# Patient Record
Sex: Female | Born: 1965 | Race: Black or African American | Hispanic: No | Marital: Single | Smoking: Never smoker
Health system: Southern US, Community
[De-identification: ages and names within clinical notes are randomized; demographics above are authoritative.]

## PROBLEM LIST (undated history)

## (undated) DIAGNOSIS — M1990 Unspecified osteoarthritis, unspecified site: Secondary | ICD-10-CM

## (undated) DIAGNOSIS — I82409 Acute embolism and thrombosis of unspecified deep veins of unspecified lower extremity: Secondary | ICD-10-CM

## (undated) DIAGNOSIS — I10 Essential (primary) hypertension: Secondary | ICD-10-CM

## (undated) DIAGNOSIS — E119 Type 2 diabetes mellitus without complications: Secondary | ICD-10-CM

## (undated) HISTORY — PX: ABDOMINAL HYSTERECTOMY: SHX81

## (undated) HISTORY — PX: APPENDECTOMY: SHX54

---

## 2016-03-30 ENCOUNTER — Emergency Department (HOSPITAL_COMMUNITY): Payer: Managed Care, Other (non HMO)

## 2016-03-30 ENCOUNTER — Emergency Department (HOSPITAL_COMMUNITY)
Admission: EM | Admit: 2016-03-30 | Discharge: 2016-03-30 | Disposition: A | Discharge status: Home / Self Care | Payer: Managed Care, Other (non HMO) | Attending: Emergency Medicine | Admitting: Emergency Medicine

## 2016-03-30 ENCOUNTER — Encounter (HOSPITAL_COMMUNITY): Payer: Self-pay | Admitting: *Deleted

## 2016-03-30 DIAGNOSIS — M25562 Pain in left knee: Secondary | ICD-10-CM | POA: Diagnosis present

## 2016-03-30 MED ORDER — IBUPROFEN 400 MG PO TABS: 600.0000 mg | ORAL_TABLET | Freq: Once | ORAL | Status: DC

## 2016-03-30 MED ORDER — NAPROXEN 500 MG PO TABS: 500.0000 mg | ORAL_TABLET | Freq: Two times a day (BID) | ORAL | 0 refills | Status: DC

## 2016-03-30 NOTE — ED Triage Notes (Signed)
PT reports she her knee pain started over 1 week ago. 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

## 2016-03-30 NOTE — ED Triage Notes (Signed)
PT reports taking Advil with out relief of LT knee pain.

## 2016-03-30 NOTE — ED Notes (Signed)
Declined W/C at D/C and was escorted to lobby by RN. 

## 2016-03-30 NOTE — Discharge Instructions (Signed)
Take your medication as prescribed as needed for pain/swelling. I recommend resting, elevating and applying ice to her left knee for 15 minutes 3-4 times daily. If your pain has not improved over the next week I recommend following up with the orthopedic clinic listed above. Please return to the Emergency Department if symptoms worsen or new onset of fever, redness, swelling, warmth, numbness, tingling, weakness..Marland Kitchen

## 2016-03-30 NOTE — ED Provider Notes (Signed)
[facility]-EMERGENCY DEPT Provider Note   CSN: [id number] Arrival date & time: 03/30/16  [id number]     History   Chief Complaint Chief Complaint  Patient presents with  . Knee Pain    HPI Nancy Cobb is a 50 y.o. female.  Patient is a 50 year old female no pertinent past medical history presents the ED with complaint of left knee pain, onset one week. 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 She notes over the past week she has had gradually worsening pain to her left medial knee with mild swelling. She notes the pain is worse with ambulating or flexion. Denies any recent fall or known injury. Patient reports feeling an intermittent "clicking" sensation to her left medial knee when ambulating. Denies fever, redness, warmth, numbness, tingling, weakness. Denies any prior injury or surgery to her left knee.      History reviewed. No pertinent past medical history.  There are no active problems to display for this patient.   Past Surgical History:  Procedure Laterality Date  . ABDOMINAL HYSTERECTOMY    . APPENDECTOMY      OB History    No data available       Home Medications    Prior to Admission medications   Medication Sig Start Date End Date Taking? Authorizing Provider  naproxen (NAPROSYN) 500 MG tablet Take 1 tablet (500 mg total) by mouth 2 (two) times daily. 03/30/16   Barrett HenleNicole Elizabeth Nadeau, PA-C    Family History History reviewed. No pertinent family history.  Social History Social History  Substance Use Topics  . Smoking status: Never Smoker  . Smokeless tobacco: Never Used  . Alcohol use No     Allergies   Review of patient's allergies indicates no known allergies.   Review of Systems Review of Systems  Constitutional: Negative for fever.  Musculoskeletal: Positive for arthralgias (leftk nee) and joint swelling.  Skin: Negative for wound.  Neurological:  Negative for weakness and numbness.     Physical Exam Updated Vital Signs BP 152/86 (BP Location: Right Arm)   Pulse (!) 56   Temp 98.4 F (36.9 C) (Oral)   Resp 16   Ht 5\' 5"  (1.651 m)   Wt 99.8 kg   SpO2 100%   BMI 36.61 kg/m   Physical Exam  Constitutional: She is oriented to person, place, and time. She appears well-developed and well-nourished. No distress.  HENT:  Head: Normocephalic and atraumatic.  Eyes: Conjunctivae and EOM are normal. Right eye exhibits no discharge. Left eye exhibits no discharge. No scleral icterus.  Neck: Normal range of motion. Neck supple.  Cardiovascular: Normal rate and intact distal pulses.   Pulmonary/Chest: Effort normal.  Abdominal: She exhibits no distension.  Musculoskeletal: She exhibits tenderness. She exhibits no edema or deformity.       Left knee: She exhibits decreased range of motion (due to reported pain), swelling (mild swelling to medial anterior knee) and MCL laxity. She exhibits no effusion, no ecchymosis, no deformity, no laceration, no erythema, normal alignment, no LCL laxity and normal patellar mobility. Tenderness found. Medial joint line tenderness noted.       Legs: Mild swelling and tenderness noted to left anterior medial knee. Decreased range of motion due to reported pain with flexion. No ACL/MCL/PCL/LCL laxity. No joint effusion, erythema or warmth noted. No tenderness over left femur or tib-fib. Sensation grossly intact. 2+ PT pulse.  Neurological: She is alert and oriented to person, place, and time.  Skin: Skin is warm and dry. Capillary refill takes less than 2 seconds. She is not diaphoretic.  Nursing note and vitals reviewed.    ED Treatments / Results  Labs (all labs ordered are listed, but only abnormal results are displayed) Labs Reviewed - No data to display  EKG  EKG Interpretation None       Radiology Dg Knee Complete 4 Views Left  Result Date: 03/30/2016 CLINICAL DATA:  50 year old female  with a history of aching left knee pain EXAM: LEFT KNEE - COMPLETE 4+ VIEW COMPARISON:  None. FINDINGS: No acute fracture identified. Tricompartmental osteoarthritis. No joint effusion. No radiopaque foreign body. IMPRESSION: Negative for acute bony abnormality. Tricompartmental osteoarthritis Signed, Yvone Neu. Loreta Ave, DO [facility] Radiology Electronically Signed   By: Gilmer Mor D.O.   On: 03/30/2016 09:01    Procedures Procedures (including critical care time)  Medications Ordered in ED Medications - No data to display   Initial Impression / Assessment and Plan / ED Course  I have reviewed the triage vital signs and the nursing notes.  Pertinent labs & imaging results that were available during my care of the patient were reviewed by me and considered in my medical decision making (see chart for details).  Clinical Course    Patient presents with left knee pain and swelling that started over the past week. She reports walking on her feet for 8 hours at her job where she delivers mail. Denies any recent fall or injury. Denies fever. VSS. Exam revealed mild swelling and tenderness to left anterior medial knee. Decreased range of motion due to reported pain. No ligament laxity noted. Pt is able ambulate, no bony abnormality or deformity, no erythema or excessive heat, no evidence of cellulitis, DVT, or septic joint.  Left knee x-ray revealed osteoarthritis, no acute abnormality. Discussed results and plan for discharge with patient. Plan to discharge patient home with symptomatic treatment including NSAIDs and cryotherapy. Patient given orthopedic follow-up and advise to schedule an appointment if her symptoms have not improved over the next week. Discussed return precautions.  Final Clinical Impressions(s) / ED Diagnoses   Final diagnoses:  Acute pain of left knee    New Prescriptions Discharge Medication List as of 03/30/2016  9:13 AM      START taking these medications   Details  naproxen (NAPROSYN) 500 MG tablet Take 1 tablet (500 mg total) by mouth 2 (two) times daily., Starting Sat 03/30/2016, Print         Satira Sark [place], [place] 03/30/16 1639    Glynn Octave, MD 03/30/16 [phone]

## 2017-03-10 ENCOUNTER — Emergency Department (HOSPITAL_COMMUNITY)
Admission: EM | Admit: 2017-03-10 | Discharge: 2017-03-11 | Disposition: A | Discharge status: Home / Self Care | Payer: Managed Care, Other (non HMO) | Attending: Emergency Medicine | Admitting: Emergency Medicine

## 2017-03-10 ENCOUNTER — Encounter (HOSPITAL_COMMUNITY): Payer: Self-pay | Admitting: *Deleted

## 2017-03-10 DIAGNOSIS — M545 Low back pain, unspecified: Secondary | ICD-10-CM

## 2017-03-10 DIAGNOSIS — I10 Essential (primary) hypertension: Secondary | ICD-10-CM | POA: Diagnosis not present

## 2017-03-10 HISTORY — DX: Essential (primary) hypertension: I10

## 2017-03-10 MED ORDER — HYDROCODONE-ACETAMINOPHEN 5-325 MG PO TABS
1.0000 | ORAL_TABLET | Freq: Once | ORAL | Status: AC
  Administered 2017-03-11: 1 via ORAL
  Filled 2017-03-10: qty 1

## 2017-03-10 NOTE — ED Triage Notes (Signed)
The pt lifted a heavy piece of equipment Saturday at work  Since then she has been working  But the pain is not getting better  lmp none

## 2017-03-10 NOTE — ED Provider Notes (Signed)
[facility]-EMERGENCY DEPT Provider Note   CSN: [id number] Arrival date & time: 03/10/17  2217     History   Chief Complaint Chief Complaint  Patient presents with  . Back Pain    HPI Nancy Cobb is a 51 y.o. female.  The history is provided by the patient and medical records. No language interpreter was used.  Back Pain   Pertinent negatives include no fever, no numbness, no abdominal pain, no dysuria and no weakness.   Nancy Cobb is a 51 y.o. female  with a PMH of HTN who presents to the Emergency Department complaining of acute onset of low back pain 2 days ago. 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 She was at work on Saturday (2 days ago) and picked up a large package weighing approximately 35 pounds. Within seconds of lifting the package, she suddenly developed sharp pain to central low back. She continued to work throughout the day and felt as if the pain was improving. When she awoke the next morning, pain had worsened. She tried ibuprofen with no improvement. Pain is worse when sitting and certain movements. Pain is better when standing. No history of similar sxs. Patient denies upper back or neck pain. No fever, saddle anesthesia, weakness, numbness, urinary complaints including retention/incontinence. No history of cancer, IVDU, or recent spinal procedures.  Past Medical History:  Diagnosis Date  . Hypertension     There are no active problems to display for this patient.   Past Surgical History:  Procedure Laterality Date  . ABDOMINAL HYSTERECTOMY    . APPENDECTOMY      OB History    No data available       Home Medications    Prior to Admission medications   Medication Sig Start Date End Date Taking? Authorizing Provider  methocarbamol (ROBAXIN) 500 MG tablet Take 1 tablet (500 mg total) by mouth 2 (two) times daily as needed for muscle spasms. 03/11/17   Ward, Chase Picket, PA-C  naproxen (NAPROSYN) 500 MG tablet Take 1 tablet (500 mg total) by  mouth 2 (two) times daily as needed for moderate pain. 03/11/17   Ward, Chase Picket, PA-C    Family History No family history on file.  Social History Social History  Substance Use Topics  . Smoking status: Never Smoker  . Smokeless tobacco: Never Used  . Alcohol use No     Allergies   Patient has no known allergies.   Review of Systems Review of Systems  Constitutional: Negative for chills and fever.  Gastrointestinal: Negative for abdominal pain, nausea and vomiting.  Genitourinary: Negative for difficulty urinating, dysuria, frequency and urgency.  Musculoskeletal: Positive for back pain. Negative for neck pain.  Neurological: Negative for weakness and numbness.     Physical Exam Updated Vital Signs BP 139/83   Pulse (!) 59   Temp 98.7 F (37.1 C)   Resp 16   Ht  (1.676 m)   Wt 101.6 kg (224 lb)   SpO2 98%   BMI 36.15 kg/m   Physical Exam  Constitutional: She is oriented to person, place, and time. She appears well-developed and well-nourished.  Neck:  No midline or paraspinal tenderness. Full ROM without pain.  Cardiovascular: Normal rate, regular rhythm, normal heart sounds and intact distal pulses.   Pulmonary/Chest: Effort normal and breath sounds normal. No respiratory distress.  Abdominal: Soft. Bowel sounds are normal. She exhibits no distension. There is no tenderness.  Musculoskeletal:  Arms: Tenderness to palpation as depicted in image. 5/5 muscle strength in bilateral LE's. Straight leg raises are negative bilaterally for radicular symptoms.  Neurological: She is alert and oriented to person, place, and time. She has normal reflexes.  Bilateral lower extremities neurovascularly intact.  Skin: Skin is warm and dry. No rash noted. No erythema.  Nursing note and vitals reviewed.    ED Treatments / Results  Labs (all labs ordered are listed, but only abnormal results are displayed) Labs Reviewed  POC URINE PREG, ED    EKG  EKG  Interpretation None       Radiology Dg Lumbar Spine Complete  Result Date: 03/11/2017 CLINICAL DATA:  Low back pain after lifting injury on Saturday. EXAM: LUMBAR SPINE - COMPLETE 4+ VIEW COMPARISON:  None. FINDINGS: Normal lumbar segmentation. Facet sclerosis from L2 through S1 consistent with degenerative facet arthropathy. Mild-to-moderate disc space narrowing T12-L1 and L5-S1. No spondylolysis or spondylolisthesis. The included sacroiliac joints and sacrum appear intact. No acute fracture of the lumbar spine. IMPRESSION: 1. Mild-to-moderate disc space narrowing at T12-L1 and L5-S1 with associated multilevel degenerative facet arthropathy. 2. No acute fracture or listhesis. Electronically Signed   By: Tollie Eth M.D.   On: 03/11/2017 01:56    Procedures Procedures (including critical care time)  Medications Ordered in ED Medications  HYDROcodone-acetaminophen (NORCO/VICODIN) 5-325 MG per tablet 1 tablet (1 tablet Oral Given 03/11/17 0015)     Initial Impression / Assessment and Plan / ED Course  I have reviewed the triage vital signs and the nursing notes.  Pertinent labs & imaging results that were available during my care of the patient were reviewed by me and considered in my medical decision making (see chart for details).    Nancy Cobb is a 51 y.o. female who presents to ED for low back pain after lifting a heavy box 2 days ago. Patient demonstrates no lower extremity weakness, saddle anesthesia, bowel or bladder incontinence or neuro deficits. No concern for cauda equina. No fevers or other infectious symptoms to suggest that the patient's back pain is due to an infection. Lower extremities are neurovascularly intact. Plan to obtain lumbar spine plain film. If negative, discharge to home with symptomatic home care. Plan discussed with oncoming provider [facility] who will follow up on imaging.   Final Clinical Impressions(s) / ED Diagnoses   Final diagnoses:  Acute  bilateral low back pain without sciatica    New Prescriptions Discharge Medication List as of 03/11/2017  1:25 AM       Ward, Chase Picket, PA-C 03/11/17 1708    Benjiman Core, MD 03/13/17 [phone]

## 2017-03-11 ENCOUNTER — Emergency Department (HOSPITAL_COMMUNITY): Payer: Managed Care, Other (non HMO)

## 2017-03-11 LAB — POC URINE PREG, ED: Preg Test, Ur: NEGATIVE

## 2017-03-11 MED ORDER — NAPROXEN 500 MG PO TABS: 500.0000 mg | ORAL_TABLET | Freq: Two times a day (BID) | ORAL | 0 refills | Status: DC | PRN

## 2017-03-11 MED ORDER — METHOCARBAMOL 500 MG PO TABS: 500.0000 mg | ORAL_TABLET | Freq: Two times a day (BID) | ORAL | 0 refills | Status: DC | PRN

## 2017-03-11 NOTE — ED Notes (Signed)
Ready for xray.

## 2017-03-11 NOTE — ED Notes (Signed)
To xray via wheelchair.

## 2017-03-11 NOTE — Discharge Instructions (Signed)
It was my pleasure taking care of you today!   You have been seen in the Emergency Department today for back pain.   Naproxen twice daily as needed for pain. Robaxin is your muscle relaxer. In addition to this, use ice and/or heat for additional pain relief.  Your back pain should get better over the next 2 weeks. Please follow up with your doctor this week for a recheck if still having symptoms.  COLD THERAPY DIRECTIONS:  Ice or gel packs can be used to reduce both pain and swelling. Ice is the most helpful within the first 24 to 48 hours after an injury or flareup from overusing a muscle or joint.  Ice is effective, has very few side effects, and is safe for most people to use.    Return to the ED for worsening back pain, fever, weakness or numbness of either leg, or if you develop either (1) an inability to urinate or have bowel movements, or (2) loss of your ability to control your bathroom functions (if you start having "accidents"), or if you develop other new symptoms that concern you.

## 2017-12-26 ENCOUNTER — Other Ambulatory Visit (HOSPITAL_COMMUNITY): Payer: Self-pay | Admitting: General Surgery

## 2018-01-12 ENCOUNTER — Encounter: Payer: Self-pay | Admitting: Skilled Nursing Facility1

## 2018-01-12 ENCOUNTER — Ambulatory Visit (HOSPITAL_COMMUNITY)
Admission: RE | Admit: 2018-01-12 | Discharge: 2018-01-12 | Disposition: A | Discharge status: Home / Self Care | Payer: Managed Care, Other (non HMO) | Source: Ambulatory Visit | Attending: General Surgery | Admitting: General Surgery

## 2018-01-12 ENCOUNTER — Other Ambulatory Visit: Payer: Self-pay

## 2018-01-12 ENCOUNTER — Encounter: Payer: Managed Care, Other (non HMO) | Attending: General Surgery | Admitting: Skilled Nursing Facility1

## 2018-01-12 DIAGNOSIS — E669 Obesity, unspecified: Secondary | ICD-10-CM

## 2018-01-12 DIAGNOSIS — Z713 Dietary counseling and surveillance: Secondary | ICD-10-CM | POA: Insufficient documentation

## 2018-01-12 DIAGNOSIS — K30 Functional dyspepsia: Secondary | ICD-10-CM | POA: Insufficient documentation

## 2018-01-12 DIAGNOSIS — Z6839 Body mass index (BMI) 39.0-39.9, adult: Secondary | ICD-10-CM | POA: Insufficient documentation

## 2018-01-12 DIAGNOSIS — J984 Other disorders of lung: Secondary | ICD-10-CM | POA: Diagnosis not present

## 2018-01-12 DIAGNOSIS — Z01818 Encounter for other preprocedural examination: Secondary | ICD-10-CM | POA: Diagnosis present

## 2018-01-12 NOTE — Progress Notes (Signed)
Pre-Op Assessment Visit:  Pre-Operative Sleeve Surgery  Medical Nutrition Therapy:  Appt start time: 3:28  End time:  4:30  Patient was seen on 01/12/2018 for Pre-Operative Nutrition Assessment. Assessment and [facility] of approval [facility] coordinator on 01/12/2018.   According to referral pt needs 3 SWL.   Pt expectation of surgery: to be more healthy, to have less knee pain  Pt expectation of Dietitian: none  Start weight at [facility]: 230.6 BMI: 39.58  24 hr Dietary Recall: First Meal: yogurt or skipped Snack:  Second Meal: yogurt or trail mix or skipped Snack:  Third Meal: fried chicken or burgers with broccoli or green beans or salad and french fries Snack: chips or popcorn  Beverages: sugary green tea, water, gatorade   Encouraged to engage in 150 minutes of moderate physical activity including cardiovascular and weight baring weekly  Handouts given during visit include:  . Pre-Op Goals . Bariatric Surgery Protein Shakes During the appointment today the following Pre-Op Goals were reviewed with the patient: . Maintain or lose weight as instructed by your surgeon . Make healthy food choices . Begin to limit portion sizes . Limited concentrated sugars and fried foods . Keep fat/sugar in the single digits per serving on             food labels . Practice CHEWING your food  (aim for 30 chews per bite or until applesauce consistency) . Practice not drinking 15 minutes before, during, and 30 minutes after each meal/snack . Avoid all carbonated beverages  . Avoid/limit caffeinated beverages  . Avoid all sugar-sweetened beverages . Consume 3 meals per day; eat every 3-5 hours . Make a list of non-food related activities . Aim for 64-100 ounces of FLUID daily  . Aim for at least 60-80 grams of PROTEIN daily . Look for a liquid protein source that contain ?15 g protein and ?5 g carbohydrate  (ex: shakes, drinks, shots)  -Follow  diet recommendations listed below   Energy and Macronutrient Recomendations: Calories: 1600 Carbohydrate: 180 Protein: 120 Fat: 44  Demonstrated degree of understanding via:  Teach Back  Teaching Method Utilized:  Visual Auditory Hands on  Barriers to learning/adherence to lifestyle change: none identified   Patient to call the [facility] to enroll in Pre-Op and Post-Op Nutrition Education when surgery date is scheduled.

## 2018-02-11 ENCOUNTER — Encounter: Payer: Managed Care, Other (non HMO) | Attending: General Surgery | Admitting: Skilled Nursing Facility1

## 2018-02-11 ENCOUNTER — Encounter: Payer: Self-pay | Admitting: Skilled Nursing Facility1

## 2018-02-11 DIAGNOSIS — Z713 Dietary counseling and surveillance: Secondary | ICD-10-CM | POA: Diagnosis present

## 2018-02-11 DIAGNOSIS — Z6839 Body mass index (BMI) 39.0-39.9, adult: Secondary | ICD-10-CM | POA: Insufficient documentation

## 2018-02-11 DIAGNOSIS — E669 Obesity, unspecified: Secondary | ICD-10-CM

## 2018-02-11 NOTE — Progress Notes (Signed)
Sleeve Assessment:   1st SWL Appointment.   According to referral pt needs 3 SWL.   Pt arrives having lost about 6 pounds. Pt states she has cut out soda and drinking more water. Pt states she feels cutting out soda will be sustainable as long as she has tea. Pt states when she goes to the store she reads the label. Pt states she really wants to go out to dinner.  Pt arrives having met her goals she was working on. Pt states she will be going to the panther/steeler game with her son and grandson.    Marland Kitchen Avoid all sugar-sweetened beverages . Consume 3 meals per day; eat every 3-5 hours  When going out: Get a to go box and put half in before you start eating Always order a non starchy veggie Never order fried   Walk 3 days a week after work for 40 minutes   And drink more water: 2 32 ounce cups   Start weight at [facility]: 230.6 Wt: 224.3 pounds BMI: 38.45  MEDICATIONS: see list   DIETARY INTAKE:  24-hr recall:  B ( AM): 2 yogurts  Snk ( AM):  L ( PM): lunch meat  Snk ( PM): trailmix D ( PM): baked chicken or tuna or shrimp with salad  Snk ( PM):  Beverages: diet tea, water, crystal light  Usual physical activity: ADL's  Diet to Follow: 1600 calories 180 g carbohydrates 120 g protein 44 g fat   Nutritional Diagnosis:  [place]-3.3 Overweight/obesity related to past poor dietary habits and physical inactivity as evidenced by patient w/ planned sleeve surgery following dietary guidelines for continued weight loss.    Intervention:  Nutrition counseling for upcoming Bariatric Surgery. Goals: -Encouraged to engage in 150 minutes of moderate physical activity including cardiovascular and weight baring weekly  Teaching Method Utilized:  Visual Auditory Hands on  Handouts given during visit include:  Meal ideas Barriers to learning/adherence to lifestyle change: none identified   Demonstrated degree of understanding via:  Teach Back   Monitoring/Evaluation:  Dietary intake,  exercise,and body weight prn.

## 2018-02-12 ENCOUNTER — Ambulatory Visit: Payer: Self-pay | Admitting: Skilled Nursing Facility1

## 2018-03-16 ENCOUNTER — Encounter: Payer: Managed Care, Other (non HMO) | Attending: General Surgery | Admitting: Skilled Nursing Facility1

## 2018-03-16 ENCOUNTER — Encounter: Payer: Self-pay | Admitting: Skilled Nursing Facility1

## 2018-03-16 DIAGNOSIS — Z713 Dietary counseling and surveillance: Secondary | ICD-10-CM | POA: Insufficient documentation

## 2018-03-16 DIAGNOSIS — Z6839 Body mass index (BMI) 39.0-39.9, adult: Secondary | ICD-10-CM | POA: Diagnosis not present

## 2018-03-16 DIAGNOSIS — E669 Obesity, unspecified: Secondary | ICD-10-CM

## 2018-03-16 NOTE — Progress Notes (Signed)
Sleeve Assessment:  2nd SWL Appointment.   According to referral pt needs 3 SWL.   Pt arrives having lost about 3 months. Pt states she has been in a lot of pain in her knees and needed shots but since the shots she has been able to get back out moving more. Pt states she has only had 2 sodas since the last appt. Pt states she has had 3 meals a day.   Pt states for the next month she wants to work in more activity and needs to work on not drinking with meals and chewing well  Start weight at [facility]: 230.6 Wt: 221.3 pounds BMI: 38.07  MEDICATIONS: see list   DIETARY INTAKE:  24-hr recall:  B ( 6:50AM): 2 yogurts or cereal (frosted mini wheats) Snk (10:30-11 AM): yogurt L (1:30 PM): yogurt and fruit and [place] Snk ( PM): trailmix D ( PM): baked chicken or tuna or shrimp with salad  Snk ( PM):  Beverages: diet tea, water, crystal light  Usual physical activity: ADL's  Diet to Follow: 1600 calories 180 g carbohydrates 120 g protein 44 g fat   Nutritional Diagnosis:  [place]-3.3 Overweight/obesity related to past poor dietary habits and physical inactivity as evidenced by patient w/ planned sleeve surgery following dietary guidelines for continued weight loss.    Intervention:  Nutrition counseling for upcoming Bariatric Surgery. Goals: -Encouraged to engage in 150 minutes of moderate physical activity including cardiovascular and weight baring weekly  Teaching Method Utilized:  Visual Auditory Hands on  Handouts given during visit include:  Meal ideas Barriers to learning/adherence to lifestyle change: none identified   Demonstrated degree of understanding via:  Teach Back   Monitoring/Evaluation:  Dietary intake, exercise,and body weight prn.

## 2018-03-24 ENCOUNTER — Ambulatory Visit (INDEPENDENT_AMBULATORY_CARE_PROVIDER_SITE_OTHER): Payer: Managed Care, Other (non HMO) | Admitting: Psychiatry

## 2018-03-24 DIAGNOSIS — F509 Eating disorder, unspecified: Secondary | ICD-10-CM | POA: Diagnosis not present

## 2018-04-15 ENCOUNTER — Encounter: Payer: Managed Care, Other (non HMO) | Attending: General Surgery | Admitting: Skilled Nursing Facility1

## 2018-04-15 ENCOUNTER — Encounter: Payer: Self-pay | Admitting: Skilled Nursing Facility1

## 2018-04-15 DIAGNOSIS — E669 Obesity, unspecified: Secondary | ICD-10-CM

## 2018-04-15 DIAGNOSIS — Z713 Dietary counseling and surveillance: Secondary | ICD-10-CM | POA: Diagnosis present

## 2018-04-15 DIAGNOSIS — Z6839 Body mass index (BMI) 39.0-39.9, adult: Secondary | ICD-10-CM | POA: Diagnosis not present

## 2018-04-15 NOTE — Progress Notes (Signed)
Sleeve Assessment: 3rd SWL Appointment.   According to referral pt needs 3 SWL.   Pt arrives having gained about 7 pounds. 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 Pt states she struggles with not drinking with meals, eating 3 meals a day. Pt state she will do what she is supposed to do because she wants nothing more than to be successful. Pt states she feels a routine will help her to be successful. Pt states alarms will help. Pt states she feels she has been most successful with liking water now when before she hated water. Pt states he plans on joining [employer].    Start weight at [facility]: 230.6 Wt: 228 BMI: 39.14  MEDICATIONS: see list   DIETARY INTAKE:  24-hr recall:  B ( 6:50AM): 2 yogurts or cereal (frosted mini wheats) Snk (10:30-11 AM): yogurt L (1:30 PM): yogurt and fruit and [place] Snk ( PM): trailmix D ( PM): baked chicken or tuna or shrimp with salad  Snk ( PM):  Beverages: diet tea, water, crystal light  Usual physical activity: ADL's  Diet to Follow: 1600 calories 180 g carbohydrates 120 g protein 44 g fat   Nutritional Diagnosis:  [place]-3.3 Overweight/obesity related to past poor dietary habits and physical inactivity as evidenced by patient w/ planned sleeve surgery following dietary guidelines for continued weight loss.    Intervention:  Nutrition counseling for upcoming Bariatric Surgery. Goals: -Encouraged to engage in 150 minutes of moderate physical activity including cardiovascular and weight baring weekly -Work on being active  -Keep up the great work!  Teaching Method Utilized:  Visual Auditory Hands on  Handouts given during visit include:  Meal ideas Barriers to learning/adherence to lifestyle change: none identified   Demonstrated degree of understanding via:  Teach Back   Monitoring/Evaluation:  Dietary intake, exercise,and body weight prn.

## 2018-04-27 ENCOUNTER — Encounter: Payer: 59 | Attending: General Surgery | Admitting: Skilled Nursing Facility1

## 2018-04-27 ENCOUNTER — Encounter: Payer: Self-pay | Admitting: Skilled Nursing Facility1

## 2018-04-27 DIAGNOSIS — E669 Obesity, unspecified: Secondary | ICD-10-CM

## 2018-04-27 DIAGNOSIS — Z713 Dietary counseling and surveillance: Secondary | ICD-10-CM | POA: Diagnosis present

## 2018-04-27 DIAGNOSIS — Z6839 Body mass index (BMI) 39.0-39.9, adult: Secondary | ICD-10-CM | POA: Diagnosis not present

## 2018-04-27 NOTE — Progress Notes (Signed)
Pre-Operative Nutrition Class:  Appt start time: 1157   End time:  1830.  Patient was seen on 04/27/2018 for Pre-Operative Bariatric Surgery Education [facility].   Surgery date:  Surgery type: sleeve Start weight at [facility]: 230.6 Weight today: 236.2  Samples given per MNT protocol. Patient educated on appropriate usage: Bariatric Advantage Multivitamin Lot # [id number] Exp:10/20  Bariatric Advantage Calcium  Lot # [id number] Exp: jul-25-2020  Renee Pain Protein Powder  Shake Lot # [id number]         March-12-2018 Exp: 08/2019        [id number] The following the learning objectives were met by the patient during this course:  Identify Pre-Op Dietary Goals and will begin 2 weeks pre-operatively  Identify appropriate sources of fluids and proteins   State protein recommendations and appropriate sources pre and post-operatively  Identify Post-Operative Dietary Goals and will follow for 2 weeks post-operatively  Identify appropriate multivitamin and calcium sources  Describe the need for physical activity post-operatively and will follow MD recommendations  State when to call healthcare provider regarding medication questions or post-operative complications  Handouts given during class include:  Pre-Op Bariatric Surgery Diet Handout  Protein Shake Handout  Post-Op Bariatric Surgery Nutrition Handout  [facility] Program Information Flyer  Support Group Information Flyer  [facility] Bariatric Supplements Price List  Follow[facility]: [facility] per MD.

## 2018-04-28 ENCOUNTER — Ambulatory Visit (INDEPENDENT_AMBULATORY_CARE_PROVIDER_SITE_OTHER): Payer: 59 | Admitting: Psychiatry

## 2018-04-28 DIAGNOSIS — F509 Eating disorder, unspecified: Secondary | ICD-10-CM | POA: Diagnosis not present

## 2018-05-08 ENCOUNTER — Ambulatory Visit: Payer: Self-pay | Admitting: General Surgery

## 2018-05-19 ENCOUNTER — Telehealth: Payer: Self-pay | Admitting: Skilled Nursing Facility1

## 2018-05-19 NOTE — Telephone Encounter (Signed)
Pt called to say she was not approved for surgery due to Blood pressure and weight changes up and down.

## 2018-05-21 ENCOUNTER — Telehealth: Payer: Self-pay | Admitting: Skilled Nursing Facility1

## 2018-05-21 NOTE — Telephone Encounter (Signed)
Pt called to vent about the fact she was not approved for surgery by her insurance company. Pt states she feels depressed and like she did all this for no reason.   Dietitian listened to what the pt had to say and advised she let her know if she needed to do anything to help her in her process.

## 2018-06-02 ENCOUNTER — Inpatient Hospital Stay: Admit: 2018-06-02 | Payer: 59 | Admitting: General Surgery

## 2018-06-02 SURGERY — GASTRECTOMY, SLEEVE, LAPAROSCOPIC
Anesthesia: General

## 2018-06-08 ENCOUNTER — Ambulatory Visit: Payer: Self-pay

## 2018-07-02 ENCOUNTER — Emergency Department (HOSPITAL_COMMUNITY): Payer: 59

## 2018-07-02 ENCOUNTER — Emergency Department (HOSPITAL_COMMUNITY)
Admission: EM | Admit: 2018-07-02 | Discharge: 2018-07-02 | Disposition: A | Discharge status: Home / Self Care | Payer: 59 | Attending: Emergency Medicine | Admitting: Emergency Medicine

## 2018-07-02 ENCOUNTER — Encounter (HOSPITAL_COMMUNITY): Payer: Self-pay | Admitting: Emergency Medicine

## 2018-07-02 DIAGNOSIS — M5412 Radiculopathy, cervical region: Secondary | ICD-10-CM | POA: Insufficient documentation

## 2018-07-02 DIAGNOSIS — IMO0002 Reserved for concepts with insufficient information to code with codable children: Secondary | ICD-10-CM

## 2018-07-02 DIAGNOSIS — I10 Essential (primary) hypertension: Secondary | ICD-10-CM | POA: Insufficient documentation

## 2018-07-02 DIAGNOSIS — M5126 Other intervertebral disc displacement, lumbar region: Secondary | ICD-10-CM | POA: Insufficient documentation

## 2018-07-02 DIAGNOSIS — M519 Unspecified thoracic, thoracolumbar and lumbosacral intervertebral disc disorder: Secondary | ICD-10-CM

## 2018-07-02 DIAGNOSIS — R200 Anesthesia of skin: Secondary | ICD-10-CM | POA: Diagnosis present

## 2018-07-02 LAB — CBC WITH DIFFERENTIAL/PLATELET
Abs Immature Granulocytes: 0.03 10*3/uL (ref 0.00–0.07)
Basophils Absolute: 0.1 10*3/uL (ref 0.0–0.1)
Basophils Relative: 1 %
Eosinophils Absolute: 0.2 10*3/uL (ref 0.0–0.5)
Eosinophils Relative: 3 %
HCT: 40.7 % (ref 36.0–46.0)
Hemoglobin: 13.4 g/dL (ref 12.0–15.0)
IMMATURE GRANULOCYTES: 0 %
Lymphocytes Relative: 38 %
Lymphs Abs: 2.6 10*3/uL (ref 0.7–4.0)
MCH: 28.7 pg (ref 26.0–34.0)
MCHC: 32.9 g/dL (ref 30.0–36.0)
MCV: 87.2 fL (ref 80.0–100.0)
Monocytes Absolute: 0.4 10*3/uL (ref 0.1–1.0)
Monocytes Relative: 6 %
Neutro Abs: 3.5 10*3/uL (ref 1.7–7.7)
Neutrophils Relative %: 52 %
Platelets: 264 10*3/uL (ref 150–400)
RBC: 4.67 MIL/uL (ref 3.87–5.11)
RDW: 12.6 % (ref 11.5–15.5)
WBC: 6.8 10*3/uL (ref 4.0–10.5)
nRBC: 0 % (ref 0.0–0.2)

## 2018-07-02 LAB — BASIC METABOLIC PANEL
ANION GAP: 8 (ref 5–15)
BUN: 13 mg/dL (ref 6–20)
CO2: 27 mmol/L (ref 22–32)
Calcium: 9.4 mg/dL (ref 8.9–10.3)
Chloride: 105 mmol/L (ref 98–111)
Creatinine, Ser: 0.9 mg/dL (ref 0.44–1.00)
GFR calc Af Amer: >60 mL/min (ref >60)
GFR calc non Af Amer: >60 mL/min (ref >60)
Glucose, Bld: 89 mg/dL (ref 70–99)
POTASSIUM: 3.8 mmol/L (ref 3.5–5.1)
Sodium: 140 mmol/L (ref 135–145)

## 2018-07-02 LAB — URINALYSIS, ROUTINE W REFLEX MICROSCOPIC
Bilirubin Urine: NEGATIVE
Glucose, UA: NEGATIVE mg/dL
Hgb urine dipstick: NEGATIVE
Ketones, ur: NEGATIVE mg/dL
LEUKOCYTES UA: NEGATIVE
Nitrite: NEGATIVE
Protein, ur: NEGATIVE mg/dL
Specific Gravity, Urine: 1.011 (ref 1.005–1.030)
pH: 6 (ref 5.0–8.0)

## 2018-07-02 MED ORDER — HYDROCODONE-ACETAMINOPHEN 5-325 MG PO TABS: 1.0000 | ORAL_TABLET | ORAL | 0 refills | Status: DC | PRN

## 2018-07-02 MED ORDER — DEXAMETHASONE SODIUM PHOSPHATE 10 MG/ML IJ SOLN
10.0000 mg | Freq: Once | INTRAMUSCULAR | Status: AC
  Administered 2018-07-02: 10 mg via INTRAVENOUS
  Filled 2018-07-02: qty 1

## 2018-07-02 MED ORDER — GADOBUTROL 1 MMOL/ML IV SOLN
10.0000 mL | Freq: Once | INTRAVENOUS | Status: AC | PRN
  Administered 2018-07-02: 10 mL via INTRAVENOUS

## 2018-07-02 MED ORDER — KETOROLAC TROMETHAMINE 30 MG/ML IJ SOLN
30.0000 mg | Freq: Once | INTRAMUSCULAR | Status: AC
  Administered 2018-07-02: 30 mg via INTRAVENOUS
  Filled 2018-07-02: qty 1

## 2018-07-02 MED ORDER — PREDNISONE 10 MG (21) PO TBPK: ORAL_TABLET | ORAL | 0 refills | Status: DC

## 2018-07-02 NOTE — ED Provider Notes (Signed)
[facility]   CSN: [id number] Arrival date & time: 07/02/18  0630     History   Chief Complaint Chief Complaint  Patient presents with  . Numbness    HPI Nancy Needleara Cobb is a 53 y.o. female.  Pt presents to the ED today with numbness and pain to her right arm.  Sx have been going on for 1 month, but are getting worse.  She said she is right handed and keeps dropping things like cups.  She is unable to hold onto a pen.  She has a twin sister with MS, but she's never been told she has MS.  She denies any other neurologic abn.  She has an appt with her doctor next week, but the pain and numbness have gotten so bad, she is worried.  Pain woke her from sleep last night.     Past Medical History:  Diagnosis Date  . Hypertension     There are no active problems to display for this patient.   Past Surgical History:  Procedure Laterality Date  . ABDOMINAL HYSTERECTOMY    . APPENDECTOMY       OB History   No obstetric history on file.      Home Medications    Prior to Admission medications   Medication Sig Start Date End Date Taking? Authorizing Provider  HYDROcodone-acetaminophen (NORCO/VICODIN) 5-325 MG tablet Take 1 tablet by mouth every 4 (four) hours as needed. 07/02/18   Jacalyn LefevreHaviland, Ariana Juul, MD  methocarbamol (ROBAXIN) 500 MG tablet Take 1 tablet (500 mg total) by mouth 2 (two) times daily as needed for muscle spasms. 03/11/17   Ward, Chase PicketJaime Pilcher, PA-C  naproxen (NAPROSYN) 500 MG tablet Take 1 tablet (500 mg total) by mouth 2 (two) times daily as needed for moderate pain. 03/11/17   Ward, Chase PicketJaime Pilcher, PA-C  predniSONE (STERAPRED UNI-PAK 21 TAB) 10 MG (21) TBPK tablet Take 6 tabs for 2 days, then 5 for 2 days, then 4 for 2 days, then 3 for 2 days, 2 for 2 days, then 1 for 2 days 07/02/18   Jacalyn LefevreHaviland, Kashawn Manzano, MD    Family History No family history on file.  Social History Social History   Tobacco Use  . Smoking status:  Never Smoker  . Smokeless tobacco: Never Used  Substance Use Topics  . Alcohol use: No  . Drug use: No     Allergies   Patient has no known allergies.   Review of Systems Review of Systems  Musculoskeletal:       Right hand pain and numbness  All other systems reviewed and are negative.    Physical Exam Updated Vital Signs BP 116/68   Pulse 67   Temp 98.4 F (36.9 C) (Oral)   Resp 12   Ht 5\' 4"  (1.626 m)   Wt 102.1 kg   SpO2 99%   BMI 38.62 kg/m   Physical Exam Vitals signs and nursing note reviewed.  Constitutional:      Appearance: Normal appearance.  HENT:     Head: Normocephalic and atraumatic.     Right Ear: External ear normal.     Left Ear: External ear normal.     Nose: Nose normal.     Mouth/Throat:     Mouth: Mucous membranes are moist.     Pharynx: Oropharynx is clear.  Eyes:     Extraocular Movements: Extraocular movements intact.     Conjunctiva/sclera: Conjunctivae normal.     Pupils:  Pupils are equal, round, and reactive to light.  Neck:     Musculoskeletal: Normal range of motion and neck supple.  Cardiovascular:     Rate and Rhythm: Normal rate and regular rhythm.     Pulses: Normal pulses.     Heart sounds: Normal heart sounds.  Pulmonary:     Effort: Pulmonary effort is normal.     Breath sounds: Normal breath sounds.  Abdominal:     General: Abdomen is flat. Bowel sounds are normal.     Palpations: Abdomen is soft.  Musculoskeletal: Normal range of motion.  Skin:    General: Skin is warm and dry.     Capillary Refill: Capillary refill takes less than 2 seconds.  Neurological:     Mental Status: She is alert and oriented to person, place, and time.     Comments: Subjective numbness right hand  Psychiatric:        Mood and Affect: Mood normal.        Behavior: Behavior normal.        Thought Content: Thought content normal.        Judgment: Judgment normal.      ED Treatments / Results  Labs (all labs ordered are listed,  but only abnormal results are displayed) Labs Reviewed  BASIC METABOLIC PANEL  CBC WITH DIFFERENTIAL/PLATELET  URINALYSIS, ROUTINE W REFLEX MICROSCOPIC    EKG None  Radiology Mr Laqueta JeanBrain W And Wo Contrast  Result Date: 07/02/2018 CLINICAL DATA:  Right hand and forearm numbness with tingling and pain for 1 month. EXAM: MRI HEAD WITHOUT AND WITH CONTRAST TECHNIQUE: Multiplanar, multiecho pulse sequences of the brain and surrounding structures were obtained without and with intravenous contrast. CONTRAST:  10 cc Gadavist intravenous COMPARISON:  None. FINDINGS: Brain: No infarction, hemorrhage, hydrocephalus, extra-axial collection or mass lesion. No white matter disease or atrophy. No abnormal enhancement Vascular: Major flow voids and vascular enhancements are preserved Skull and upper cervical spine: Negative for marrow lesion Sinuses/Orbits: Negative. IMPRESSION: Normal brain MRI. Electronically Signed   By: Marnee SpringJonathon  Watts M.D.   On: 07/02/2018 11:51   Mr Cervical Spine W Or Wo Contrast  Result Date: 07/02/2018 CLINICAL DATA:  Right hand and finger numbness and tingling with pain for 1 month. EXAM: MRI CERVICAL SPINE WITHOUT AND WITH CONTRAST TECHNIQUE: Multiplanar and multiecho pulse sequences of the cervical spine, to include the craniocervical junction and cervicothoracic junction, were obtained without and with intravenous contrast. CONTRAST:  10 cc Gadavist intravenous COMPARISON:  None. FINDINGS: Alignment: Slight anterolisthesis at C3-4 and C7-T1 Vertebrae: No fracture, evidence of discitis, or bone lesion. Cord: Normal signal and morphology. Posterior Fossa, vertebral arteries, paraspinal tissues: Negative. Disc levels: C2-3: Unremarkable. C3-4: Slight anterolisthesis with no evident underlying degenerative changes. C4-5: Disc narrowing and bulging with endplate and uncovertebral ridging. Moderate to advanced right foraminal stenosis. Mild canal and left foraminal narrowing C5-6: Disc  narrowing and bulging with superimposed right paracentral protrusion impinging on the C6 nerve root. Mild spinal stenosis without visible cord deformity. Mild left foraminal narrowing. C6-7: Disc narrowing and bulging with moderate to advanced right and mild left foraminal stenosis. Mild spinal stenosis C7-T1:Facet spurring and slight anterolisthesis.  No impingement IMPRESSION: 1. 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 Right-sided impingement is greatest at C5-6 due to a right paracentral protrusion. 2.  C7-T1 facet degeneration with mild anterolisthesis. 3. Motion degraded. Electronically Signed   By: Marnee SpringJonathon  Watts M.D.   On: 07/02/2018 11:39  Procedures Procedures (including critical care time)  Medications Ordered in ED Medications  ketorolac (TORADOL) 30 MG/ML injection 30 mg (30 mg Intravenous Given 07/02/18 0747)  dexamethasone (DECADRON) injection 10 mg (10 mg Intravenous Given 07/02/18 0747)  gadobutrol (GADAVIST) 1 MMOL/ML injection 10 mL (10 mLs Intravenous Contrast Given 07/02/18 1130)     Initial Impression / Assessment and Plan / ED Course  I have reviewed the triage vital signs and the nursing notes.  Pertinent labs & imaging results that were available during my care of the patient were reviewed by me and considered in my medical decision making (see chart for details).    Pt's pain has improved.  She is glad to see there is no evidence of MS or stroke on brain MRI.  She does have right sided impingement especially at C5-C6.  She will be d/c home with prednisone and lortab.  She has an appt with her doctor next week.  She is instructed to f/u with NS.   Final Clinical Impressions(s) / ED Diagnoses   Final diagnoses:  Intervertebral disc prolapse with impingement  Cervical radiculopathy    ED Discharge Orders         Ordered    predniSONE (STERAPRED UNI-PAK 21 TAB) 10 MG (21) TBPK tablet     07/02/18 1203    HYDROcodone-acetaminophen  (NORCO/VICODIN) 5-325 MG tablet  Every 4 hours PRN     07/02/18 1203    Ambulatory referral to Neurosurgery     07/02/18 1204           Jacalyn Lefevre, MD 07/02/18 1205

## 2018-07-02 NOTE — ED Notes (Signed)
Patient transported to MRI 

## 2018-07-02 NOTE — ED Triage Notes (Signed)
Pt reports R Hand/FA numbness,tingling and pain X1 mo. Starts in her palm and radiates into elbow. Sensory deficits present, weaker grip.

## 2018-07-09 ENCOUNTER — Other Ambulatory Visit: Payer: Self-pay | Admitting: Neurosurgery

## 2018-07-09 DIAGNOSIS — M50122 Cervical disc disorder at C5-C6 level with radiculopathy: Secondary | ICD-10-CM

## 2018-07-17 ENCOUNTER — Ambulatory Visit
Admission: RE | Admit: 2018-07-17 | Discharge: 2018-07-17 | Disposition: A | Discharge status: Home / Self Care | Payer: 59 | Source: Ambulatory Visit | Attending: Neurosurgery | Admitting: Neurosurgery

## 2018-07-17 DIAGNOSIS — M50122 Cervical disc disorder at C5-C6 level with radiculopathy: Secondary | ICD-10-CM

## 2018-07-17 MED ORDER — IOPAMIDOL (ISOVUE-M 300) INJECTION 61%
1.0000 mL | Freq: Once | INTRAMUSCULAR | Status: AC | PRN
  Administered 2018-07-17: 1 mL via EPIDURAL

## 2018-07-17 MED ORDER — TRIAMCINOLONE ACETONIDE 40 MG/ML IJ SUSP (RADIOLOGY)
60.0000 mg | Freq: Once | INTRAMUSCULAR | Status: AC
  Administered 2018-07-17: 60 mg via EPIDURAL

## 2018-07-17 NOTE — Discharge Instructions (Signed)

## 2018-08-14 ENCOUNTER — Other Ambulatory Visit: Payer: Self-pay | Admitting: Physician Assistant

## 2018-08-14 DIAGNOSIS — M50122 Cervical disc disorder at C5-C6 level with radiculopathy: Secondary | ICD-10-CM

## 2018-08-28 ENCOUNTER — Ambulatory Visit
Admission: RE | Admit: 2018-08-28 | Discharge: 2018-08-28 | Disposition: A | Discharge status: Home / Self Care | Payer: 59 | Source: Ambulatory Visit | Attending: Physician Assistant | Admitting: Physician Assistant

## 2018-08-28 ENCOUNTER — Other Ambulatory Visit: Payer: Self-pay

## 2018-08-28 DIAGNOSIS — M50122 Cervical disc disorder at C5-C6 level with radiculopathy: Secondary | ICD-10-CM

## 2018-08-28 MED ORDER — TRIAMCINOLONE ACETONIDE 40 MG/ML IJ SUSP (RADIOLOGY)
60.0000 mg | Freq: Once | INTRAMUSCULAR | Status: AC
  Administered 2018-08-28: 60 mg via EPIDURAL

## 2018-08-28 MED ORDER — IOPAMIDOL (ISOVUE-M 300) INJECTION 61%
1.0000 mL | Freq: Once | INTRAMUSCULAR | Status: AC | PRN
  Administered 2018-08-28: 1 mL via EPIDURAL

## 2018-08-28 NOTE — Discharge Instructions (Signed)

## 2019-03-12 ENCOUNTER — Other Ambulatory Visit: Payer: Self-pay | Admitting: Family Medicine

## 2019-03-12 ENCOUNTER — Ambulatory Visit
Admission: RE | Admit: 2019-03-12 | Discharge: 2019-03-12 | Disposition: A | Discharge status: Home / Self Care | Payer: 59 | Source: Ambulatory Visit | Attending: Family Medicine | Admitting: Family Medicine

## 2019-03-12 DIAGNOSIS — M1310 Monoarthritis, not elsewhere classified, unspecified site: Secondary | ICD-10-CM

## 2019-03-12 DIAGNOSIS — M545 Low back pain, unspecified: Secondary | ICD-10-CM

## 2019-03-12 DIAGNOSIS — M62838 Other muscle spasm: Secondary | ICD-10-CM

## 2019-03-25 ENCOUNTER — Other Ambulatory Visit: Payer: Self-pay | Admitting: Neurosurgery

## 2019-03-25 DIAGNOSIS — M50123 Cervical disc disorder at C6-C7 level with radiculopathy: Secondary | ICD-10-CM

## 2019-03-26 ENCOUNTER — Other Ambulatory Visit: Payer: Self-pay | Admitting: Neurosurgery

## 2019-03-26 DIAGNOSIS — G8929 Other chronic pain: Secondary | ICD-10-CM

## 2019-04-02 ENCOUNTER — Ambulatory Visit
Admission: RE | Admit: 2019-04-02 | Discharge: 2019-04-02 | Disposition: A | Discharge status: Home / Self Care | Payer: 59 | Source: Ambulatory Visit | Attending: Neurosurgery | Admitting: Neurosurgery

## 2019-04-02 ENCOUNTER — Other Ambulatory Visit: Payer: Self-pay

## 2019-04-02 DIAGNOSIS — M50123 Cervical disc disorder at C6-C7 level with radiculopathy: Secondary | ICD-10-CM

## 2019-04-02 MED ORDER — TRIAMCINOLONE ACETONIDE 40 MG/ML IJ SUSP (RADIOLOGY)
60.0000 mg | Freq: Once | INTRAMUSCULAR | Status: AC
  Administered 2019-04-02: 60 mg via EPIDURAL

## 2019-04-02 MED ORDER — IOPAMIDOL (ISOVUE-M 300) INJECTION 61%
1.0000 mL | Freq: Once | INTRAMUSCULAR | Status: AC
  Administered 2019-04-02: 1 mL via EPIDURAL

## 2019-04-02 NOTE — Discharge Instructions (Signed)

## 2019-04-07 ENCOUNTER — Other Ambulatory Visit: Payer: Self-pay

## 2019-04-07 ENCOUNTER — Other Ambulatory Visit: Payer: 59

## 2019-04-07 ENCOUNTER — Ambulatory Visit
Admission: RE | Admit: 2019-04-07 | Discharge: 2019-04-07 | Disposition: A | Discharge status: Home / Self Care | Payer: 59 | Source: Ambulatory Visit | Attending: Neurosurgery | Admitting: Neurosurgery

## 2019-04-07 DIAGNOSIS — M545 Low back pain, unspecified: Secondary | ICD-10-CM

## 2019-04-07 DIAGNOSIS — G8929 Other chronic pain: Secondary | ICD-10-CM

## 2019-06-17 ENCOUNTER — Ambulatory Visit: Payer: 59 | Attending: Internal Medicine

## 2019-06-17 DIAGNOSIS — Z20822 Contact with and (suspected) exposure to covid-19: Secondary | ICD-10-CM

## 2019-06-19 LAB — NOVEL CORONAVIRUS, NAA: SARS-CoV-2, NAA: NOT DETECTED

## 2019-11-13 IMAGING — MR MR CERVICAL SPINE WO/W CM
7 of 9 series · 29 of 48 positions shown · IV contrast (gadavist)
Comparison: None.

CLINICAL DATA: Right hand and finger numbness and tingling with
pain for 1 month.

EXAM:
MRI CERVICAL SPINE WITHOUT AND WITH CONTRAST
TECHNIQUE: Multiplanar and multiecho pulse sequences of the cervical spine, to
include the craniocervical junction and cervicothoracic junction,
were obtained without and with intravenous contrast.
CONTRAST:  10 cc Gadavist intravenous

[Series 20: T2 · sagittal · 3.0mm · 0.69mm/px · 4 of 13 slices shown (1 of 2)]
[im 1/13]
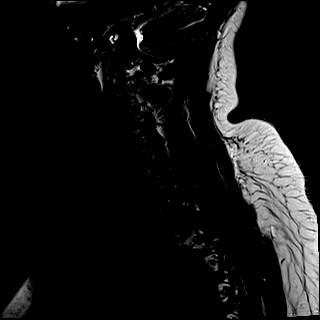
[im 5/13]
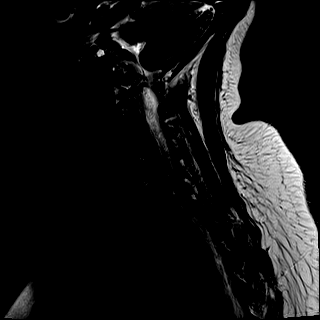
[im 9/13]
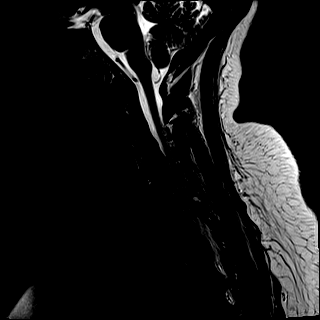
[im 13/13]
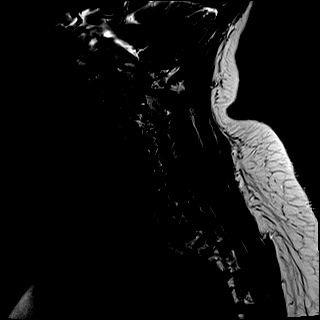

[Series 21: T1 · sagittal · 3.0mm · 0.69mm/px · 3 of 13 slices shown (1 of 2)]
[im 1/13]
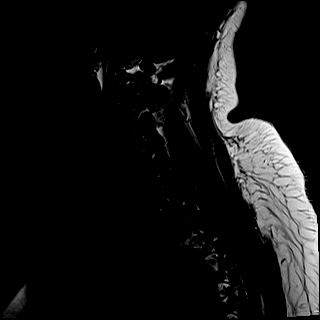
[im 7/13]
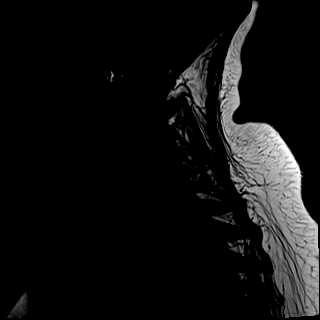
[im 13/13]
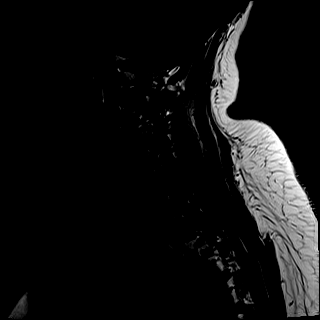

[Series 22: STIR · sagittal · 3.0mm · 0.86mm/px · 3 of 13 slices shown]
[im 1/13]
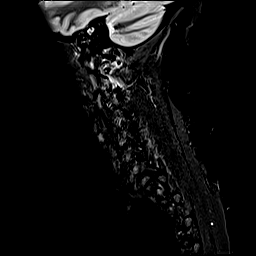
[im 7/13]
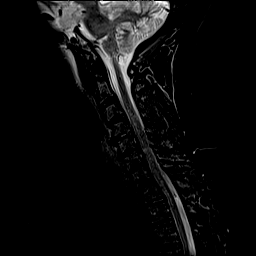
[im 13/13]
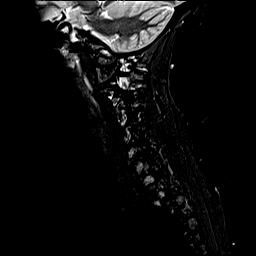

[Series 25: T2 · axial · 3.0mm · 0.66mm/px · z∈[-243,-142]mm · 7 of 34 slices shown (2 of 2)]
[im 1/34]
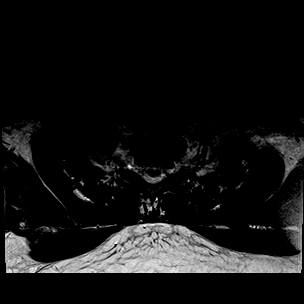
[im 6/34]
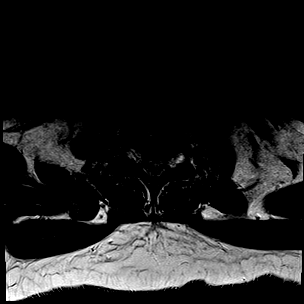
[im 12/34]
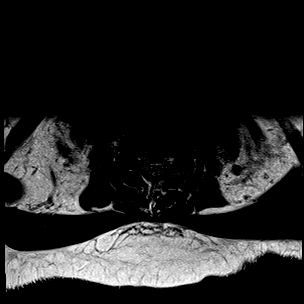
[im 17/34]
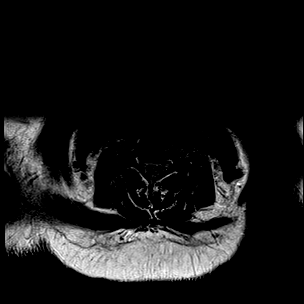
[im 23/34]
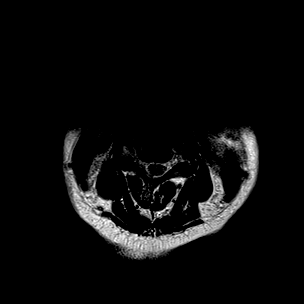
[im 28/34]
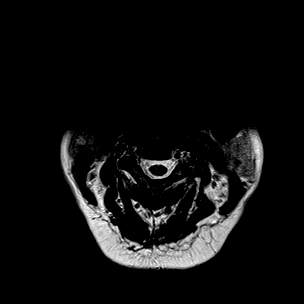
[im 34/34]
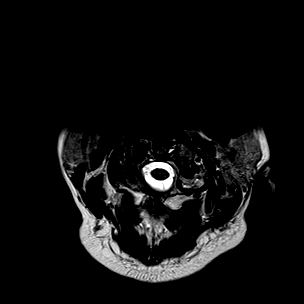

[Series 26: T1 · axial · 3.0mm · 0.39mm/px · z∈[-243,-142]mm · 7 of 34 slices shown (2 of 2)]
[im 1/34]
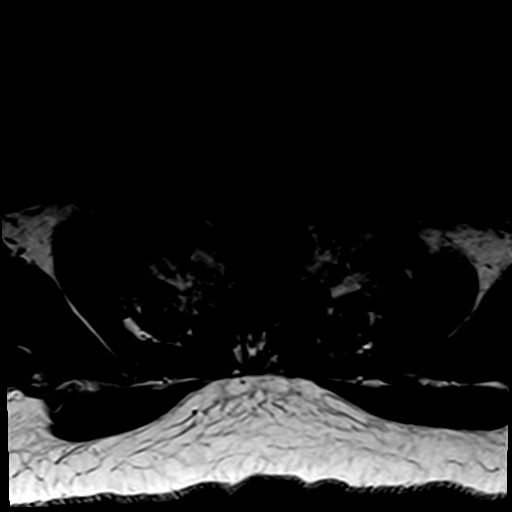
[im 6/34]
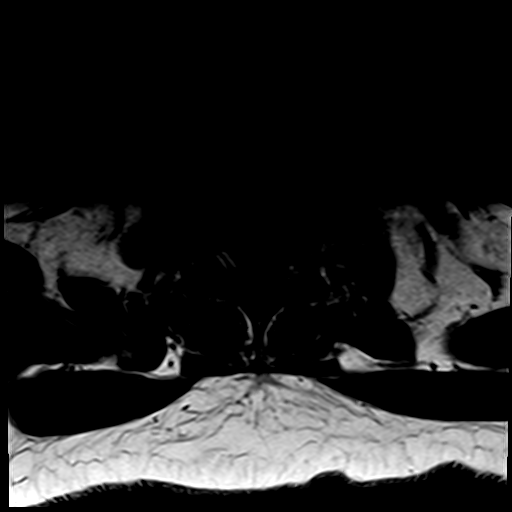
[im 12/34]
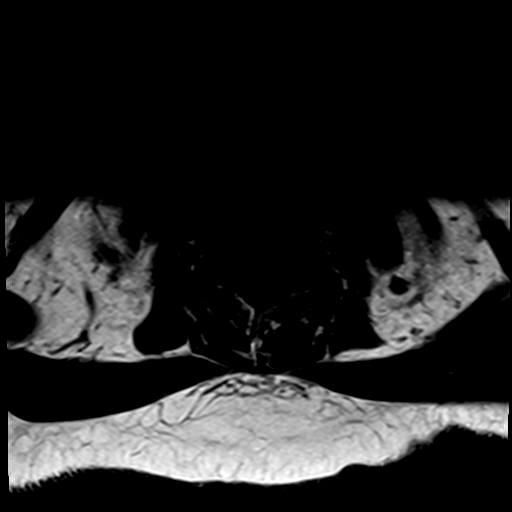
[im 17/34]
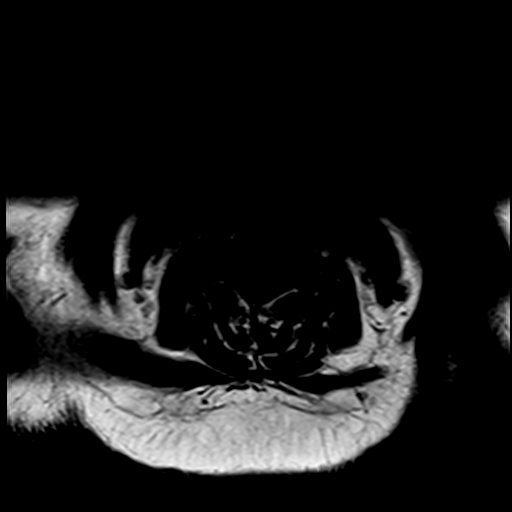
[im 23/34]
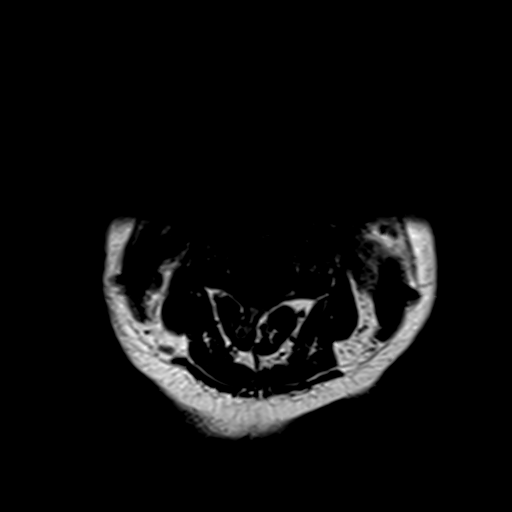
[im 28/34]
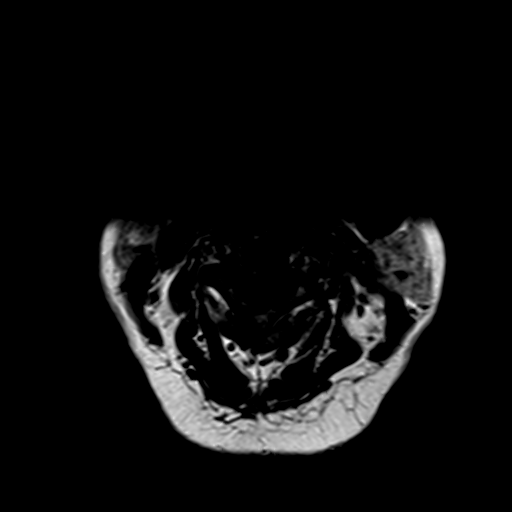
[im 34/34]
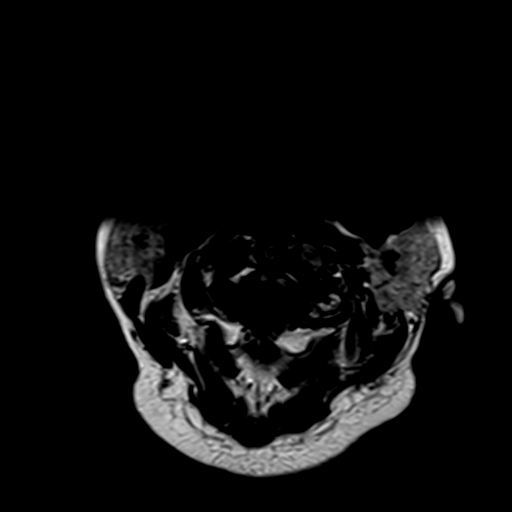

[Series 28: T1 fat-sat post-contrast · sagittal · 3.0mm · 0.43mm/px · 3 of 13 slices shown]
[im 1/13]
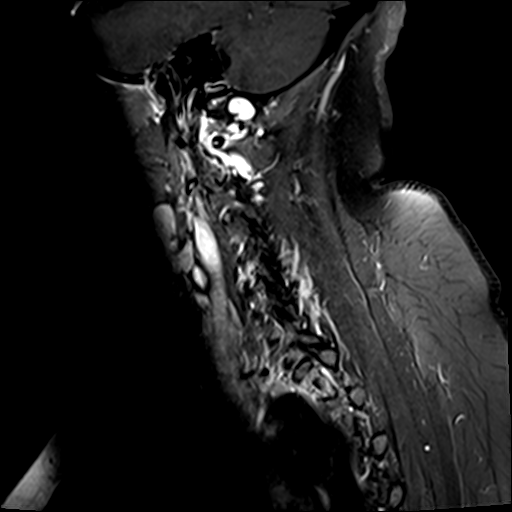
[im 7/13]
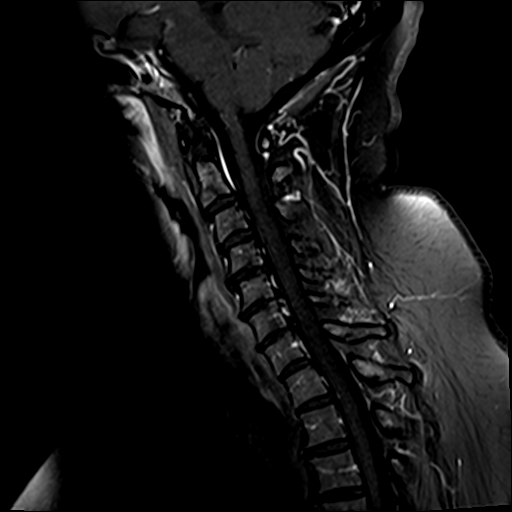
[im 13/13]
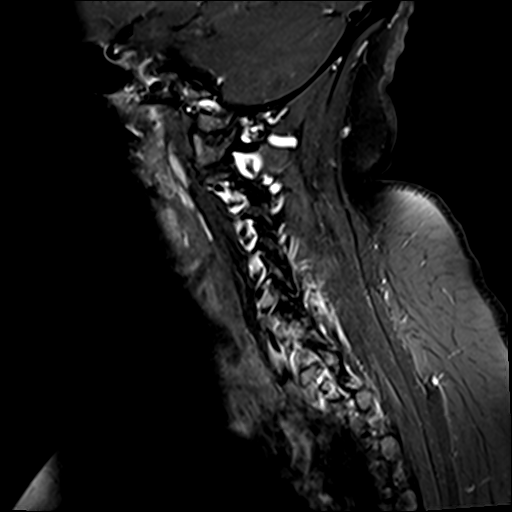

[Series 29: T1 post-contrast · axial · 3.0mm · 0.39mm/px · z∈[-243,-228]mm · 2 of 34 slices shown]
[im 1/34]
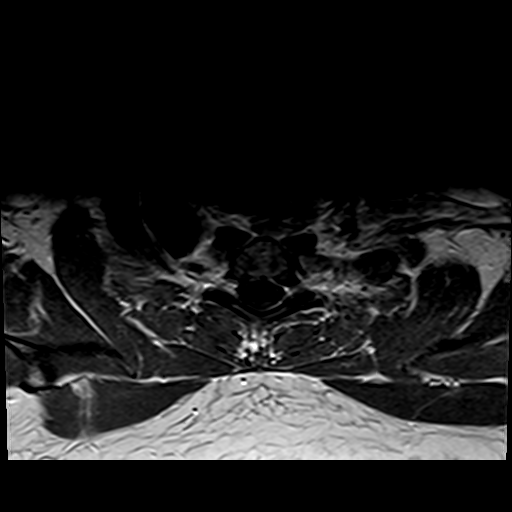
[im 6/34]
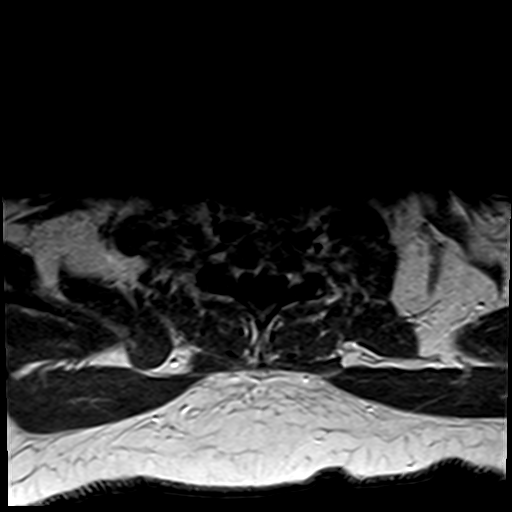

[29 of 48 positions shown; findings below may reference images not displayed]

FINDINGS: Alignment: Slight anterolisthesis at C3-4 and C7-T1

Vertebrae: No fracture, evidence of discitis, or bone lesion.

Cord: Normal signal and morphology.

Posterior Fossa, vertebral arteries, paraspinal tissues: Negative.

Disc levels:

C2-3: Unremarkable.

C3-4: Slight anterolisthesis with no evident underlying degenerative
changes.

C4-5: Disc narrowing and bulging with endplate and uncovertebral
ridging. Moderate to advanced right foraminal stenosis. Mild canal
and left foraminal narrowing

C5-6: Disc narrowing and bulging with superimposed right paracentral
protrusion impinging on the C6 nerve root. Mild spinal stenosis
without visible cord deformity. Mild left foraminal narrowing.

C6-7: Disc narrowing and bulging with moderate to advanced right and
mild left foraminal stenosis. Mild spinal stenosis

C7-T1:Facet spurring and slight anterolisthesis.  No impingement
IMPRESSION: 1. Disc degeneration from C4-5 to C6-7 with right foraminal
impingement. Right-sided impingement is greatest at C5-6 due to a
right paracentral protrusion.
2.  C7-T1 facet degeneration with mild anterolisthesis.
3. Motion degraded.

## 2020-03-17 ENCOUNTER — Other Ambulatory Visit: Payer: Self-pay | Admitting: Neurosurgery

## 2020-03-17 DIAGNOSIS — M50122 Cervical disc disorder at C5-C6 level with radiculopathy: Secondary | ICD-10-CM

## 2020-04-12 ENCOUNTER — Other Ambulatory Visit: Payer: 59

## 2020-04-20 ENCOUNTER — Ambulatory Visit
Admission: RE | Admit: 2020-04-20 | Discharge: 2020-04-20 | Disposition: A | Discharge status: Home / Self Care | Payer: 59 | Source: Ambulatory Visit | Attending: Neurosurgery | Admitting: Neurosurgery

## 2020-04-20 DIAGNOSIS — M50122 Cervical disc disorder at C5-C6 level with radiculopathy: Secondary | ICD-10-CM

## 2020-04-20 MED ORDER — TRIAMCINOLONE ACETONIDE 40 MG/ML IJ SUSP (RADIOLOGY)
60.0000 mg | Freq: Once | INTRAMUSCULAR | Status: AC
  Administered 2020-04-20: 60 mg via EPIDURAL

## 2020-04-20 MED ORDER — IOPAMIDOL (ISOVUE-M 300) INJECTION 61%
1.0000 mL | Freq: Once | INTRAMUSCULAR | Status: AC | PRN
  Administered 2020-04-20: 1 mL via EPIDURAL

## 2020-04-20 NOTE — Discharge Instructions (Signed)

## 2020-05-25 ENCOUNTER — Observation Stay (HOSPITAL_COMMUNITY)
Admission: EM | Admit: 2020-05-25 | Discharge: 2020-05-26 | Disposition: A | Discharge status: Home / Self Care | Payer: 59 | Attending: Internal Medicine | Admitting: Internal Medicine

## 2020-05-25 ENCOUNTER — Other Ambulatory Visit: Payer: Self-pay

## 2020-05-25 ENCOUNTER — Emergency Department (HOSPITAL_COMMUNITY): Payer: 59

## 2020-05-25 DIAGNOSIS — I10 Essential (primary) hypertension: Secondary | ICD-10-CM | POA: Diagnosis not present

## 2020-05-25 DIAGNOSIS — Z7982 Long term (current) use of aspirin: Secondary | ICD-10-CM | POA: Diagnosis not present

## 2020-05-25 DIAGNOSIS — I2694 Multiple subsegmental pulmonary emboli without acute cor pulmonale: Principal | ICD-10-CM | POA: Insufficient documentation

## 2020-05-25 DIAGNOSIS — Z79899 Other long term (current) drug therapy: Secondary | ICD-10-CM | POA: Insufficient documentation

## 2020-05-25 DIAGNOSIS — I2699 Other pulmonary embolism without acute cor pulmonale: Secondary | ICD-10-CM

## 2020-05-25 DIAGNOSIS — Z20822 Contact with and (suspected) exposure to covid-19: Secondary | ICD-10-CM | POA: Insufficient documentation

## 2020-05-25 DIAGNOSIS — E119 Type 2 diabetes mellitus without complications: Secondary | ICD-10-CM

## 2020-05-25 DIAGNOSIS — R079 Chest pain, unspecified: Secondary | ICD-10-CM | POA: Diagnosis present

## 2020-05-25 HISTORY — DX: Unspecified osteoarthritis, unspecified site: M19.90

## 2020-05-25 HISTORY — DX: Type 2 diabetes mellitus without complications: E11.9

## 2020-05-25 LAB — CBC
HCT: 39.8 % (ref 36.0–46.0)
Hemoglobin: 12.9 g/dL (ref 12.0–15.0)
MCH: 28 pg (ref 26.0–34.0)
MCHC: 32.4 g/dL (ref 30.0–36.0)
MCV: 86.5 fL (ref 80.0–100.0)
Platelets: 297 10*3/uL (ref 150–400)
RBC: 4.6 MIL/uL (ref 3.87–5.11)
RDW: 13.4 % (ref 11.5–15.5)
WBC: 10.8 10*3/uL — ABNORMAL HIGH (ref 4.0–10.5)
nRBC: 0 % (ref 0.0–0.2)

## 2020-05-25 LAB — TROPONIN I (HIGH SENSITIVITY)
Troponin I (High Sensitivity): <2 ng/L (ref <18)
Troponin I (High Sensitivity): <2 ng/L (ref <18)

## 2020-05-25 LAB — BASIC METABOLIC PANEL
Anion gap: 10 (ref 5–15)
BUN: 16 mg/dL (ref 6–20)
CO2: 26 mmol/L (ref 22–32)
Calcium: 9.5 mg/dL (ref 8.9–10.3)
Chloride: 101 mmol/L (ref 98–111)
Creatinine, Ser: 0.99 mg/dL (ref 0.44–1.00)
GFR, Estimated: >60 mL/min (ref >60)
Glucose, Bld: 190 mg/dL — ABNORMAL HIGH (ref 70–99)
Potassium: 4.1 mmol/L (ref 3.5–5.1)
Sodium: 137 mmol/L (ref 135–145)

## 2020-05-25 LAB — D-DIMER, QUANTITATIVE: D-Dimer, Quant: 5.38 ug/mL-FEU — ABNORMAL HIGH (ref 0.00–0.50)

## 2020-05-25 NOTE — ED Triage Notes (Signed)
Pt said chest pain began at 2am this morning in her back area then moved to the front on  the right side. SOB some nausea. Pt did have a knee surgery at the end of November and hx of blood clots

## 2020-05-26 ENCOUNTER — Encounter (HOSPITAL_COMMUNITY): Payer: Self-pay | Admitting: Family Medicine

## 2020-05-26 ENCOUNTER — Observation Stay (HOSPITAL_BASED_OUTPATIENT_CLINIC_OR_DEPARTMENT_OTHER): Payer: 59

## 2020-05-26 ENCOUNTER — Other Ambulatory Visit (HOSPITAL_COMMUNITY): Payer: Self-pay | Admitting: Internal Medicine

## 2020-05-26 ENCOUNTER — Emergency Department (HOSPITAL_COMMUNITY): Payer: 59

## 2020-05-26 DIAGNOSIS — I2699 Other pulmonary embolism without acute cor pulmonale: Secondary | ICD-10-CM

## 2020-05-26 DIAGNOSIS — I361 Nonrheumatic tricuspid (valve) insufficiency: Secondary | ICD-10-CM

## 2020-05-26 DIAGNOSIS — I10 Essential (primary) hypertension: Secondary | ICD-10-CM

## 2020-05-26 DIAGNOSIS — I2694 Multiple subsegmental pulmonary emboli without acute cor pulmonale: Secondary | ICD-10-CM

## 2020-05-26 DIAGNOSIS — E119 Type 2 diabetes mellitus without complications: Secondary | ICD-10-CM

## 2020-05-26 LAB — ECHOCARDIOGRAM COMPLETE
Area-P 1/2: 2.56 cm2
Height: 65 in
S' Lateral: 2.7 cm
Weight: 3280 oz

## 2020-05-26 LAB — BASIC METABOLIC PANEL
Anion gap: 9 (ref 5–15)
BUN: 17 mg/dL (ref 6–20)
CO2: 24 mmol/L (ref 22–32)
Calcium: 8.8 mg/dL — ABNORMAL LOW (ref 8.9–10.3)
Chloride: 98 mmol/L (ref 98–111)
Creatinine, Ser: 0.9 mg/dL (ref 0.44–1.00)
GFR, Estimated: >60 mL/min (ref >60)
Glucose, Bld: 99 mg/dL (ref 70–99)
Potassium: 3.7 mmol/L (ref 3.5–5.1)
Sodium: 131 mmol/L — ABNORMAL LOW (ref 135–145)

## 2020-05-26 LAB — CBC
HCT: 37.5 % (ref 36.0–46.0)
Hemoglobin: 12.5 g/dL (ref 12.0–15.0)
MCH: 28.7 pg (ref 26.0–34.0)
MCHC: 33.3 g/dL (ref 30.0–36.0)
MCV: 86.2 fL (ref 80.0–100.0)
Platelets: 248 10*3/uL (ref 150–400)
RBC: 4.35 MIL/uL (ref 3.87–5.11)
RDW: 13.6 % (ref 11.5–15.5)
WBC: 10.4 10*3/uL (ref 4.0–10.5)
nRBC: 0 % (ref 0.0–0.2)

## 2020-05-26 LAB — RESP PANEL BY RT-PCR (FLU A&B, COVID) ARPGX2
Influenza A by PCR: NEGATIVE
Influenza B by PCR: NEGATIVE
SARS Coronavirus 2 by RT PCR: NEGATIVE

## 2020-05-26 LAB — HIV ANTIBODY (ROUTINE TESTING W REFLEX): HIV Screen 4th Generation wRfx: NONREACTIVE

## 2020-05-26 MED ORDER — PANTOPRAZOLE SODIUM 40 MG PO TBEC: 40.0000 mg | DELAYED_RELEASE_TABLET | Freq: Every day | ORAL | 0 refills | Status: DC

## 2020-05-26 MED ORDER — ACETAMINOPHEN 650 MG RE SUPP: 650.0000 mg | Freq: Four times a day (QID) | RECTAL | Status: DC | PRN

## 2020-05-26 MED ORDER — OXYCODONE HCL 5 MG PO TABS
5.0000 mg | ORAL_TABLET | Freq: Four times a day (QID) | ORAL | Status: DC | PRN
  Administered 2020-05-26: 5 mg via ORAL
  Filled 2020-05-26: qty 1

## 2020-05-26 MED ORDER — SEMAGLUTIDE 14 MG PO TABS: 1.0000 | ORAL_TABLET | Freq: Every morning | ORAL | Status: DC

## 2020-05-26 MED ORDER — OXYCODONE HCL 5 MG PO TABS: 5.0000 mg | ORAL_TABLET | Freq: Four times a day (QID) | ORAL | 0 refills | Status: DC | PRN

## 2020-05-26 MED ORDER — SENNOSIDES-DOCUSATE SODIUM 8.6-50 MG PO TABS: 1.0000 | ORAL_TABLET | Freq: Every evening | ORAL | Status: DC | PRN

## 2020-05-26 MED ORDER — OXYCODONE HCL 5 MG PO TABS
5.0000 mg | ORAL_TABLET | ORAL | Status: DC | PRN
  Administered 2020-05-26: 5 mg via ORAL
  Filled 2020-05-26: qty 1

## 2020-05-26 MED ORDER — APIXABAN (ELIQUIS) VTE STARTER PACK (10MG AND 5MG): ORAL_TABLET | ORAL | 0 refills | Status: DC

## 2020-05-26 MED ORDER — GABAPENTIN 100 MG PO CAPS
100.0000 mg | ORAL_CAPSULE | Freq: Three times a day (TID) | ORAL | Status: DC
  Administered 2020-05-26 (×2): 100 mg via ORAL
  Filled 2020-05-26 (×2): qty 1

## 2020-05-26 MED ORDER — HYDROMORPHONE HCL 1 MG/ML IJ SOLN
1.0000 mg | INTRAMUSCULAR | Status: DC | PRN
  Administered 2020-05-26: 1 mg via INTRAVENOUS
  Filled 2020-05-26: qty 1

## 2020-05-26 MED ORDER — APIXABAN 5 MG PO TABS: 5.0000 mg | ORAL_TABLET | Freq: Two times a day (BID) | ORAL | Status: DC

## 2020-05-26 MED ORDER — APIXABAN (ELIQUIS) EDUCATION KIT FOR DVT/PE PATIENTS
PACK | Freq: Once | Status: DC
  Filled 2020-05-26: qty 1

## 2020-05-26 MED ORDER — APIXABAN 5 MG PO TABS
10.0000 mg | ORAL_TABLET | Freq: Two times a day (BID) | ORAL | Status: DC
  Administered 2020-05-26 (×2): 10 mg via ORAL
  Filled 2020-05-26 (×2): qty 2

## 2020-05-26 MED ORDER — IRBESARTAN-HYDROCHLOROTHIAZIDE 150-12.5 MG PO TABS: 1.0000 | ORAL_TABLET | Freq: Every day | ORAL | Status: DC

## 2020-05-26 MED ORDER — ASPIRIN 81 MG PO CHEW
324.0000 mg | CHEWABLE_TABLET | Freq: Once | ORAL | Status: AC
  Administered 2020-05-26: 324 mg via ORAL
  Filled 2020-05-26: qty 4

## 2020-05-26 MED ORDER — ACETAMINOPHEN 325 MG PO TABS
650.0000 mg | ORAL_TABLET | Freq: Four times a day (QID) | ORAL | Status: DC | PRN
  Administered 2020-05-26: 650 mg via ORAL
  Filled 2020-05-26: qty 2

## 2020-05-26 MED ORDER — FENTANYL CITRATE (PF) 100 MCG/2ML IJ SOLN
50.0000 ug | Freq: Once | INTRAMUSCULAR | Status: AC
Start: 2020-05-26 — End: 2020-05-26
  Administered 2020-05-26: 50 ug via INTRAVENOUS
  Filled 2020-05-26: qty 2

## 2020-05-26 MED ORDER — IOHEXOL 350 MG/ML SOLN
100.0000 mL | Freq: Once | INTRAVENOUS | Status: AC | PRN
  Administered 2020-05-26: 100 mL via INTRAVENOUS

## 2020-05-26 MED ORDER — BLOOD PRESSURE KIT: 1.0000 | PACK | Freq: Two times a day (BID) | 0 refills | Status: DC

## 2020-05-26 MED ORDER — HEPARIN (PORCINE) 25000 UT/250ML-% IV SOLN
1300.0000 [IU]/h | INTRAVENOUS | Status: DC
  Filled 2020-05-26: qty 250

## 2020-05-26 MED ORDER — CELECOXIB 200 MG PO CAPS
200.0000 mg | ORAL_CAPSULE | Freq: Two times a day (BID) | ORAL | Status: DC
  Filled 2020-05-26 (×2): qty 1

## 2020-05-26 MED ORDER — HEPARIN BOLUS VIA INFUSION
5000.0000 [IU] | Freq: Once | INTRAVENOUS | Status: DC
  Filled 2020-05-26: qty 5000

## 2020-05-26 MED FILL — oxyCODONE HCL 5 MG TABS: 5 | 5 days supply | Qty: 20 | Fill #0

## 2020-05-26 MED FILL — PANTOPRAZOLE SOD DR 40 MG T: 40 | 30 days supply | Qty: 30 | Fill #0

## 2020-05-26 MED FILL — ELIQUIS STARTER PACK 5 MG T: 5 | 28 days supply | Qty: 74 | Fill #0

## 2020-05-26 NOTE — Discharge Summary (Signed)
Discharge Summary  Nancy Cobb GYI:[id number] DOB: 04-26-1966  PCP: Nancy Lei, MD  Admit date: 05/25/2020 Discharge date: 05/26/2020  Time spent: 30 minutes  Recommendations for Outpatient Follow-up:  1. Follow-up with PCP in 1 week  Discharge Diagnoses:  Active Hospital Problems   Diagnosis Date Noted  . Pulmonary embolism ([place]) 05/26/2020  . Type 2 diabetes mellitus without complication ([place]) 35/00/9381  . Essential hypertension 05/26/2020    Resolved Hospital Problems  No resolved problems to display.    Discharge Condition: Stable  Diet recommendation: Heart healthy, moderate carb  Vitals:   05/26/20 1100 05/26/20 1230  BP: 118/61 (!) 100/55  Pulse: 87 87  Resp: (!) 29 (!) 22  Temp:    SpO2: 93% 95%    History of present illness:  Nancy Cobb is a 54 y.o. female with medical history significant for HTN, DMT2 and hx of blood clot and PE 20 years ago after surgery. She presents tonight with complaint of chest pain and SOB with exertion for 1 day. It is on the front right side of her chest and radiates to her back. She reports associated shortness of breath and nausea. She reports having recent total knee replacement on 04/28/20 and has been taking aspirin 81 mg twice a day which her surgeon told her to take. She does have history of PE and DVT 20 yrs ago after appendectomy. She is not anticoagulated at this time. She denies fever, chills, cough. She tried taking a muscle relaxer and and oxycodone with no relief. ED Course:  Nancy Cobb has an elevated D-dimer level and is found to have a nonocclusive PE bilaterally, right heart strain on CTA chest.  Patient is also found to have acute DVT in the left lower extremity.  Echo with no right heart strain.   Today, patient was able to ambulate to the bathroom and back without any issues, still complaining of some chest discomfort.  Denies any worsening shortness of breath, abdominal pain, nausea/vomiting,  fever/chills.  Hospital Course:  Principal Problem:   Pulmonary embolism (H[facility]) Active Problems:   Type 2 diabetes mellitus without complication ([place])   Essential hypertension   Pulmonary embolism Likely provoked, s/p L knee surgery CTA chest showed nonocclusive thrombus in the posterior left lower lobe segmental branches as well as right middle lobe and posterior right lower lobe subsegmental arterial branches.  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 Eliquis Patient on multiple NSAIDs for pain management, discontinued about 3 of them except for meloxicam instructed pt to take it only if pain is uncontrolled with oxycodone Start pantoprazole Follow-up with your primary care in 1 week, may need hypercoagulable work-up  Essential hypertension BP on the soft side Advised to hold BP meds and check BP twice a day, keep a log and follow-up with PCP for further adjustments  Diabetes mellitus type 2 Continue home Rybelsus Follow-up with PCP  Recent left knee replacement surgery Continue outpatient PT/OT Follow-up with orthopedics        Malnutrition Type:      Malnutrition Characteristics:      Nutrition Interventions:      Estimated body mass index is 34.11 kg/m as calculated from the following:   Height as of this encounter: _0  (1.651 m).   Weight as of this  encounter: 93 kg.    Procedures:  None  Consultations:  None  Discharge Exam: BP (!) 100/55 (BP Location: Left Arm)   Pulse 87   Temp 98.2 F (36.8 C) (Oral)   Resp (!) 22   Ht _0  (1.651 m)   Wt 93 kg   SpO2 95%   BMI 34.11 kg/m   General: NAD Cardiovascular: S1, S2 present Respiratory: CTAB    Discharge Instructions You were cared for by a  hospitalist during your hospital stay. If you have any questions about your discharge medications or the care you received while you were in the hospital after you are discharged, you can call the unit and asked to speak with the hospitalist on call if the hospitalist that took care of you is not available. Once you are discharged, your primary care physician will handle any further medical issues. Please note that NO REFILLS for any discharge medications will be authorized once you are discharged, as it is imperative that you return to your primary care physician (or establish a relationship with a primary care physician if you do not have one) for your aftercare needs so that they can reassess your need for medications and monitor your lab values.   Allergies as of 05/26/2020   No Known Allergies     Medication List    STOP taking these medications   Aspirin Adult Low Strength 81 MG EC tablet Generic drug: aspirin   celecoxib 200 MG capsule Commonly known as: CELEBREX   etodolac 400 MG tablet Commonly known as: LODINE   irbesartan-hydrochlorothiazide 150-12.5 MG tablet Commonly known as: AVALIDE   naproxen 500 MG tablet Commonly known as: NAPROSYN   predniSONE 10 MG (21) Tbpk tablet Commonly known as: STERAPRED UNI-PAK 21 TAB     TAKE these medications   Apixaban Starter Pack (81m and 541m Commonly known as: ELIQUIS STARTER PACK Take as directed on package: start with two-48m90mablets twice daily for 7 days. On day 8, switch to one-48mg748mblet twice daily.   Blood Pressure Kit 1 kit by Does not apply route in the morning and at bedtime.   gabapentin 100 MG capsule Commonly known as: NEURONTIN Take 100 mg by mouth every 8 (eight) hours.   meloxicam 15 MG tablet Commonly known as: MOBIC Take 15 mg by mouth daily.   ondansetron 4 MG tablet Commonly known as: ZOFRAN Take 4 mg by mouth every 8 (eight) hours as needed for nausea or vomiting.   oxyCODONE 5 MG immediate  release tablet Commonly known as: Oxy IR/ROXICODONE Take 1 tablet (5 mg total) by mouth every 6 (six) hours as needed for up to 5 days for severe pain. What changed:   when to take this  reasons to take this   pantoprazole 40 MG tablet Commonly known as: Protonix Take 1 tablet (40 mg total) by mouth daily.   Rybelsus 14 MG Tabs Generic drug: Semaglutide Take 1 tablet by mouth every morning.   tizanidine 2 MG capsule Commonly known as: ZANAFLEX Take 2 mg by mouth every 8 (eight) hours.      No Known Allergies  Follow-up Information    BlanLucianne Cobb. Schedule an appointment as soon as possible for a visit in 1 week(s).   Specialty: Family Medicine Contact information: [phone]            The results of significant diagnostics from this hospitalization (including  imaging, microbiology, ancillary and laboratory) are listed below for reference.    Significant Diagnostic Studies: DG Chest 2 View  Result Date: 05/25/2020 CLINICAL DATA:  cp EXAM: CHEST - 2 VIEW COMPARISON:  01/12/2018 FINDINGS: Hypoinflated lungs. Linear bibasilar opacities. No pneumothorax or pleural effusion. Stable cardiomediastinal silhouette. No acute osseous abnormality. IMPRESSION: Linear bibasilar opacities, likely atelectasis.  Hypoinflated lungs. Electronically Signed   By: Primitivo Gauze M.D.   On: 05/25/2020 19:40   CT Angio Chest PE W and/or Wo Contrast  Result Date: 05/26/2020 CLINICAL DATA:  Chest pain EXAM: CT ANGIOGRAPHY CHEST WITH CONTRAST TECHNIQUE: Multidetector CT imaging of the chest was performed using the standard protocol during bolus administration of intravenous contrast. Multiplanar CT image reconstructions and MIPs were obtained to evaluate the vascular anatomy. CONTRAST:  116m OMNIPAQUE IOHEXOL 350 MG/ML SOLN COMPARISON:  None. FINDINGS: Cardiovascular: There is a optimal opacification of the pulmonary arteries. No  central pulmonary embolism is seen. There is nonocclusive thrombus in the posterior left lower lobe subsegmental branches. There is also nonocclusive thrombus within the right middle lobe and posterior right lower lobe subsegmental arterial branches. There is mild cardiomegaly present. No pericardial effusion or thickening. No evidence right heart strain. There is normal three-vessel brachiocephalic anatomy without proximal stenosis. The thoracic aorta is normal in appearance. Mediastinum/Nodes: No hilar, mediastinal, or axillary adenopathy. Thyroid gland, trachea, and esophagus demonstrate no significant findings. Lungs/Pleura: Streaky patchy airspace opacity is seen at both lung bases with several air bronchograms within the right middle lobe. No pleural effusion or pneumothorax is seen. Upper Abdomen: No acute abnormalities present in the visualized portions of the upper abdomen. Musculoskeletal: No chest wall abnormality. No acute or significant osseous findings. Review of the MIP images confirms the above findings. IMPRESSION: 1. Nonocclusive thrombus in the posterior left lower lobe subsegmental branches as well as right middle lobe and posterior right lower lobe subsegmental arterial branches. No evidence of right ventricular heart strain. 2. Bibasilar streaky airspace opacities, which could be due to atelectasis. 3. Mild cardiomegaly Electronically Signed   By: BPrudencio PairM.D.   On: 05/26/2020 01:44   ECHOCARDIOGRAM COMPLETE  Result Date: 05/26/2020    ECHOCARDIOGRAM REPORT   Patient Name:   TKYM SCANNELLDate of Exam: 05/26/2020 Medical Rec #:  [id number]   Height:       65.0 in Accession #:    [id number]  Weight:       205.0 lb Date of Birth:  [birth year]   BSA:          1.999 m Patient Age:    89years     BP:           102/71 mmHg Patient Gender: F            HR:           76 bpm. Exam Location:  Inpatient Procedure: 2D Echo Indications:    pulmonary embolus 415.19  History:        Patient has no  prior history of Echocardiogram examinations.                 Risk Factors:Hypertension and Diabetes.  Sonographer:    LJohny ChessReferring Phys: [id number] 1. Left ventricular ejection fraction, by estimation, is 60 to 65%. The left ventricle has normal function. The left ventricle has no regional wall motion abnormalities. Left ventricular diastolic parameters are consistent with Grade I diastolic dysfunction (impaired relaxation).  2.  Right ventricular systolic function is normal. The right ventricular size is normal. There is mildly elevated pulmonary artery systolic pressure. The estimated right ventricular systolic pressure is 98.3 mmHg.  3. The mitral valve is normal in structure. No evidence of mitral valve regurgitation. No evidence of mitral stenosis.  4. Tricuspid valve regurgitation is severe.  5. The aortic valve is normal in structure. Aortic valve regurgitation is not visualized. No aortic stenosis is present.  6. The inferior vena cava is normal in size with greater than 50% respiratory variability, suggesting right atrial pressure of 3 mmHg. FINDINGS  Left Ventricle: Left ventricular ejection fraction, by estimation, is 60 to 65%. The left ventricle has normal function. The left ventricle has no regional wall motion abnormalities. The left ventricular internal cavity size was normal in size. There is  no left ventricular hypertrophy. Left ventricular diastolic parameters are consistent with Grade I diastolic dysfunction (impaired relaxation). Right Ventricle: The right ventricular size is normal. No increase in right ventricular wall thickness. Right ventricular systolic function is normal. There is mildly elevated pulmonary artery systolic pressure. The tricuspid regurgitant velocity is 3.20  m/s, and with an assumed right atrial pressure of 3 mmHg, the estimated right ventricular systolic pressure is 38.2 mmHg. Left Atrium: Left atrial size was normal in size.  Right Atrium: Right atrial size was normal in size. Pericardium: There is no evidence of pericardial effusion. Mitral Valve: The mitral valve is normal in structure. No evidence of mitral valve regurgitation. No evidence of mitral valve stenosis. Tricuspid Valve: The tricuspid valve is normal in structure. Tricuspid valve regurgitation is severe. No evidence of tricuspid stenosis. Aortic Valve: The aortic valve is normal in structure. Aortic valve regurgitation is not visualized. No aortic stenosis is present. Pulmonic Valve: The pulmonic valve was normal in structure. Pulmonic valve regurgitation is not visualized. No evidence of pulmonic stenosis. Aorta: The aortic root is normal in size and structure. Venous: The inferior vena cava is normal in size with greater than 50% respiratory variability, suggesting right atrial pressure of 3 mmHg. IAS/Shunts: No atrial level shunt detected by color flow Doppler.  LEFT VENTRICLE PLAX 2D LVIDd:         4.60 cm  Diastology LVIDs:         2.70 cm  LV e' medial:    10.70 cm/s LV PW:         1.00 cm  LV E/e' medial:  7.2 LV IVS:        1.00 cm  LV e' lateral:   12.80 cm/s LVOT diam:     1.60 cm  LV E/e' lateral: 6.0 LV SV:         47 LV SV Index:   23 LVOT Area:     2.01 cm  RIGHT VENTRICLE RV S prime:     13.80 cm/s TAPSE (M-mode): 1.7 cm LEFT ATRIUM             Index       RIGHT ATRIUM           Index LA diam:        3.70 cm 1.85 cm/m  RA Area:     11.90 cm LA Vol (A2C):   53.3 ml 26.66 ml/m RA Volume:   24.90 ml  12.46 ml/m LA Vol (A4C):   55.3 ml 27.66 ml/m LA Biplane Vol: 56.9 ml 28.46 ml/m  AORTIC VALVE LVOT Vmax:   143.00 cm/s LVOT Vmean:  83.600 cm/s LVOT VTI:  0.232 m  AORTA Ao Root diam: 3.00 cm Ao Asc diam:  2.90 cm MITRAL VALVE               TRICUSPID VALVE MV Area (PHT): 2.56 cm    TR Peak grad:   41.0 mmHg MV Decel Time: 296 msec    TR Vmax:        320.00 cm/s MV E velocity: 77.10 cm/s MV A velocity: 84.60 cm/s  SHUNTS MV E/A ratio:  0.91        Systemic  VTI:  0.23 m                            Systemic Diam: 1.60 cm Candee Furbish MD Electronically signed by Candee Furbish MD Signature Date/Time: 05/26/2020/11:07:48 AM    Final    VAS [place] LOWER EXTREMITY VENOUS (DVT)  Result Date: 05/26/2020  Lower Venous DVT Study Indications: Pulmonary embolism.  Risk Factors: Remote history of PE x20 years, left knee surgery 04/28/2020. Comparison Study: No prior study Performing Technologist: Maudry Mayhew MHA, RDMS, RVT, RDCS  Examination Guidelines: A complete evaluation includes B-mode imaging, spectral Doppler, color Doppler, and power Doppler as needed of all accessible portions of each vessel. Bilateral testing is considered an integral part of a complete examination. Limited examinations for reoccurring indications may be performed as noted. The reflux portion of the exam is performed with the patient in reverse Trendelenburg.  +---------+---------------+---------+-----------+-----------------+------------+ RIGHT    CompressibilityPhasicitySpontaneityProperties       Thrombus                                                                  Aging        +---------+---------------+---------+-----------+-----------------+------------+ CFV      Full           Yes      Yes                                      +---------+---------------+---------+-----------+-----------------+------------+ SFJ      Full                                                             +---------+---------------+---------+-----------+-----------------+------------+ FV Prox  Full                                                             +---------+---------------+---------+-----------+-----------------+------------+ FV Mid   Full                                                             +---------+---------------+---------+-----------+-----------------+------------+  FV DistalFull                                                              +---------+---------------+---------+-----------+-----------------+------------+ PFV      Full                                                             +---------+---------------+---------+-----------+-----------------+------------+ POP      Partial        Yes      Yes        spongy           Chronic                                                  w/compression                 +---------+---------------+---------+-----------+-----------------+------------+ PTV      Full                                                             +---------+---------------+---------+-----------+-----------------+------------+ PERO     Full                                                             +---------+---------------+---------+-----------+-----------------+------------+   +---------+---------------+---------+-----------+----------+--------------+ LEFT     CompressibilityPhasicitySpontaneityPropertiesThrombus Aging +---------+---------------+---------+-----------+----------+--------------+ CFV      Full                                                        +---------+---------------+---------+-----------+----------+--------------+ SFJ      Full                                                        +---------+---------------+---------+-----------+----------+--------------+ FV Prox  Full                                                        +---------+---------------+---------+-----------+----------+--------------+ FV Mid   Full                                                        +---------+---------------+---------+-----------+----------+--------------+  FV DistalFull                                                        +---------+---------------+---------+-----------+----------+--------------+ PFV      Full                                                         +---------+---------------+---------+-----------+----------+--------------+ POP      Full                                                        +---------+---------------+---------+-----------+----------+--------------+ PTV      Full                                                        +---------+---------------+---------+-----------+----------+--------------+ PERO     Full                                                        +---------+---------------+---------+-----------+----------+--------------+ Gastroc  None                    No                   Acute          +---------+---------------+---------+-----------+----------+--------------+     Summary: RIGHT: - Findings consistent with chronic deep vein thrombosis involving the right popliteal vein. - There is no evidence of acute deep vein thrombosis in the lower extremity.  - No cystic structure found in the popliteal fossa.  LEFT: - Findings consistent with acute deep vein thrombosis involving the left gastrocnemius veins. - No cystic structure found in the popliteal fossa.  *See table(s) above for measurements and observations.    Preliminary     Microbiology: Recent Results (from the past 240 hour(s))  Resp Panel by RT-PCR (Flu A&B, Covid) Nasopharyngeal Swab     Status: None   Collection Time: 05/26/20  2:57 AM   Specimen: Nasopharyngeal Swab; Nasopharyngeal(NP) swabs in vial transport medium  Result Value Ref Range Status   SARS Coronavirus 2 by RT PCR NEGATIVE NEGATIVE Final    Comment: (NOTE) SARS-CoV-2 target nucleic acids are NOT DETECTED.  The SARS-CoV-2 RNA is generally detectable in upper respiratory specimens during the acute phase of infection. The lowest concentration of SARS-CoV-2 viral copies this assay can detect is 138 copies/mL. A negative result does not preclude SARS-Cov-2 infection and should not be used as the sole basis for treatment or other patient management decisions. A negative  result may occur with  improper specimen collection/handling, submission of specimen other than nasopharyngeal swab, presence of viral mutation(s) within the areas targeted by this  assay, and inadequate number of viral copies(<138 copies/mL). A negative result must be combined with clinical observations, patient history, and epidemiological information. The expected result is Negative.  Fact Sheet for Patients:  EntrepreneurPulse.com.au  Fact Sheet for Healthcare Providers:  IncredibleEmployment.be  This test is no t yet approved or cleared by the [place] FDA and  has been authorized for detection and/or diagnosis of SARS-CoV-2 by FDA under an Emergency Use Authorization (EUA). This EUA will remain  in effect (meaning this test can be used) for the duration of the COVID-19 declaration under Section 564(b)(1) of the Act, 21 U.S.C.section 360bbb-3(b)(1), unless the authorization is terminated  or revoked sooner.       Influenza A by PCR NEGATIVE NEGATIVE Final   Influenza B by PCR NEGATIVE NEGATIVE Final    Comment: (NOTE) The Xpert Xpress SARS-CoV-2/FLU/RSV plus assay is intended as an aid in the diagnosis of influenza from Nasopharyngeal swab specimens and should not be used as a sole basis for treatment. Nasal washings and aspirates are unacceptable for Xpert Xpress SARS-CoV-2/FLU/RSV testing.  Fact Sheet for Patients: EntrepreneurPulse.com.au  Fact Sheet for Healthcare Providers: IncredibleEmployment.be  This test is not yet approved or cleared by the [place] FDA and has been authorized for detection and/or diagnosis of SARS-CoV-2 by FDA under an Emergency Use Authorization (EUA). This EUA will remain in effect (meaning this test can be used) for the duration of the COVID-19 declaration under Section 564(b)(1) of the Act, 21 U.S.C. section 360bbb-3(b)(1), unless the authorization is  terminated or revoked.  Performed at [facility], [place] [address], [place], [place] [address]      Labs: Basic Metabolic Panel: Recent Labs  Lab 05/25/20 1914 05/26/20 0328  NA 137 131*  K 4.1 3.7  CL 101 98  CO2 26 24  GLUCOSE 190* 99  BUN 16 17  CREATININE 0.99 0.90  CALCIUM 9.5 8.8*   Liver Function Tests: No results for input(s): AST, ALT, ALKPHOS, BILITOT, PROT, ALBUMIN in the last 168 hours. No results for input(s): LIPASE, AMYLASE in the last 168 hours. No results for input(s): AMMONIA in the last 168 hours. CBC: Recent Labs  Lab 05/25/20 1914 05/26/20 0328  WBC 10.8* 10.4  HGB 12.9 12.5  HCT 39.8 37.5  MCV 86.5 86.2  PLT 297 248   Cardiac Enzymes: No results for input(s): CKTOTAL, CKMB, CKMBINDEX, TROPONINI in the last 168 hours. BNP: BNP (last 3 results) No results for input(s): BNP in the last 8760 hours.  ProBNP (last 3 results) No results for input(s): PROBNP in the last 8760 hours.  CBG: No results for input(s): GLUCAP in the last 168 hours.     Signed:  Alma Friendly, MD [facility] 05/26/2020, 1:33 PM

## 2020-05-26 NOTE — ED Notes (Signed)
Patient transported to Ultrasound via stretcher. She is alert, oriented, and reports she is drowsy d/t being awake all night. Patient smiling and joking with RN, pleasant demeanor. No distress reported. VSS.

## 2020-05-26 NOTE — H&P (Signed)
History and Physical    Nancy Cobb DJM:[id number] DOB: 1965-12-10 DOA: 05/25/2020  PCP: Renaye Rakers, MD   Patient coming from:   Home  Chief Complaint: SOB, right sided chest pain  HPI: Nancy Cobb is a 54 y.o. female with medical history significant for HTN, DMT2 and hx of blood clot and PE 20 years ago after surgery. She presents tonight with complaint of chest pain and SOB with exertion. She states she developed the pain yesterday.  It is on the front right side of her chest and radiates to her back.  She reports associated shortness of breath and nausea.  She reports having recent total knee replacement on 04/28/20 and has been taking aspirin 81 mg twice a day which her surgeon told her to take.  She does have history of PE and DVT 20 yrs ago after appendectomy.  She is not anticoagulated at this time.  She denies fever, chills, cough.  She tried taking a muscle relaxer and and oxycodone with no relief.  ED Course:  Nancy Cobb has an elevated D-dimer level and is found to have a nonocclusive PE bilaterally on CTA chest.  Review of Systems:  General: Denies weakness, fever, chills, weight loss, night sweats. Denies dizziness. Denies change in appetite HENT: Denies head trauma, headache, denies change in hearing, tinnitus.  Denies nasal congestion or bleeding.  Denies sore throat, sores in mouth.  Denies difficulty swallowing Eyes: Denies blurry vision, pain in eye, drainage.  Denies discoloration of eyes. Neck: Denies pain.  Denies swelling.  Denies pain with movement. Cardiovascular: Reports right sided chest pain. Denies palpitations.  Denies edema.  Denies orthopnea Respiratory: Reports shortness of breat with exertion. Denies cough.  Denies wheezing.  Denies sputum production Gastrointestinal: Denies abdominal pain, swelling.  Denies nausea, vomiting, diarrhea.  Denies melena.  Denies hematemesis. Musculoskeletal: Denies limitation of movement.  Denies deformity or swelling.  Denies  pain.  Denies arthralgias or myalgias. Genitourinary: Denies pelvic pain.  Denies urinary frequency or hesitancy.  Denies dysuria.  Skin: Denies rash.  Denies petechiae, purpura, ecchymosis. Neurological: Denies headache.  Denies syncope.  Denies seizure activity.  Denies weakness or paresthesia.  Denies slurred speech, drooping face.  Denies visual change. Psychiatric: Denies depression, anxiety.  Denies suicidal thoughts or ideation.  Denies hallucinations.  Past Medical History:  Diagnosis Date  . Arthritis   . Diabetes mellitus without complication (HCC)   . Hypertension     Past Surgical History:  Procedure Laterality Date  . ABDOMINAL HYSTERECTOMY    . APPENDECTOMY      Social History  reports that she has never smoked. She has never used smokeless tobacco. She reports that she does not drink alcohol and does not use drugs.  No Known Allergies  History reviewed. No pertinent family history.   Prior to Admission medications   Medication Sig Start Date End Date Taking? Authorizing Provider  ASPIRIN ADULT LOW STRENGTH 81 MG EC tablet Take 81 mg by mouth 2 (two) times daily. 04/18/20  Yes [provider]  celecoxib (CELEBREX) 200 MG capsule Take 200 mg by mouth 2 (two) times daily. 04/18/20  Yes [provider]  etodolac (LODINE) 400 MG tablet Take 400 mg by mouth 2 (two) times daily. 05/07/20  Yes [provider]  gabapentin (NEURONTIN) 100 MG capsule Take 100 mg by mouth every 8 (eight) hours. 04/18/20  Yes [provider]  irbesartan-hydrochlorothiazide (AVALIDE) 150-12.5 MG tablet Take 1 tablet by mouth daily. 05/06/20  Yes [provider]  meloxicam (MOBIC) 15 MG tablet Take 15 mg by mouth daily. 01/24/20  Yes [provider]  ondansetron (ZOFRAN) 4 MG tablet Take 4 mg by mouth every 8 (eight) hours as needed for nausea or vomiting. 04/18/20  Yes [provider]  oxyCODONE (OXY IR/ROXICODONE) 5 MG immediate release  tablet Take 5 mg by mouth every 4 (four) hours. 05/09/20  Yes [provider]  RYBELSUS 14 MG TABS Take 1 tablet by mouth every morning. 02/25/20  Yes [provider]  tizanidine (ZANAFLEX) 2 MG capsule Take 2 mg by mouth every 8 (eight) hours. 04/18/20  Yes [provider]  naproxen (NAPROSYN) 500 MG tablet Take 1 tablet (500 mg total) by mouth 2 (two) times daily as needed for moderate pain. 03/11/17   Ward, Chase Picket, PA-C  predniSONE (STERAPRED UNI-PAK 21 TAB) 10 MG (21) TBPK tablet Take 6 tabs for 2 days, then 5 for 2 days, then 4 for 2 days, then 3 for 2 days, 2 for 2 days, then 1 for 2 days Patient not taking: No sig reported 07/02/18   Jacalyn Lefevre, MD    Physical Exam: Vitals:   05/26/20 0029 05/26/20 0045 05/26/20 0100 05/26/20 0259  BP: (!) 117/56 (!) 116/55 116/63 111/79  Pulse: 82 79 77 78  Resp: (!) 24 (!) 24 20 20   Temp:      TempSrc:      SpO2: 99% 97% 97% 98%  Weight:      Height:        Constitutional: NAD, calm, comfortable Vitals:   05/26/20 0029 05/26/20 0045 05/26/20 0100 05/26/20 0259  BP: (!) 117/56 (!) 116/55 116/63 111/79  Pulse: 82 79 77 78  Resp: (!) 24 (!) 24 20 20   Temp:      TempSrc:      SpO2: 99% 97% 97% 98%  Weight:      Height:       General: WDWN, Alert and oriented x3.  Eyes: EOMI, PERRL, lids and conjunctivae normal.  Sclera nonicteric HENT:  Nancy Cobb, external ears normal.  Nares patent without epistasis.  Mucous membranes are moist. Posterior pharynx clear of any exudate or lesions. Neck: Soft, normal range of motion, supple, no masses, no thyromegaly.  Trachea midline Respiratory: clear to auscultation bilaterally, no wheezing, no crackles. Normal respiratory effort. No accessory muscle use.  Cardiovascular: Regular rate and rhythm, no murmurs / rubs / gallops. No extremity edema. 2+ pedal pulses.  Abdomen: Soft, no tenderness, nondistended, no rebound or guarding. Obese. No masses palpated. Bowel sounds  normoactive Musculoskeletal: FROM. no clubbing / cyanosis. No joint deformity upper and lower extremities. Normal muscle tone. Negative homan's sign bilaterally. Skin: Warm, dry, intact no rashes, lesions, ulcers. No induration Neurologic: CN 2-12 grossly intact.  Normal speech.  Sensation intact Psychiatric: Normal judgment and insight.  Normal mood.    Labs on Admission: I have personally reviewed following labs and imaging studies  CBC: Recent Labs  Lab 05/25/20 1914  WBC 10.8*  HGB 12.9  HCT 39.8  MCV 86.5  PLT 297    Basic Metabolic Panel: Recent Labs  Lab 05/25/20 1914  NA 137  K 4.1  CL 101  CO2 26  GLUCOSE 190*  BUN 16  CREATININE 0.99  CALCIUM 9.5    GFR: Estimated Creatinine Clearance: 71.5 mL/min (by C-G formula based on SCr of 0.99 mg/dL).  Liver Function Tests: No results for input(s): AST, ALT, ALKPHOS, BILITOT, PROT, ALBUMIN in the last 168 hours.  Urine analysis:    Component Value Date/Time   COLORURINE YELLOW 07/02/2018 0721   APPEARANCEUR CLEAR 07/02/2018 0721   LABSPEC 1.011 07/02/2018 0721   PHURINE 6.0 07/02/2018 0721   GLUCOSEU NEGATIVE 07/02/2018 0721   HGBUR NEGATIVE 07/02/2018 0721   BILIRUBINUR NEGATIVE 07/02/2018 0721   KETONESUR NEGATIVE 07/02/2018 0721   PROTEINUR NEGATIVE 07/02/2018 0721   NITRITE NEGATIVE 07/02/2018 0721   LEUKOCYTESUR NEGATIVE 07/02/2018 0721    Radiological Exams on Admission: DG Chest 2 View  Result Date: 05/25/2020 CLINICAL DATA:  cp EXAM: CHEST - 2 VIEW COMPARISON:  01/12/2018 FINDINGS: Hypoinflated lungs. Linear bibasilar opacities. No pneumothorax or pleural effusion. Stable cardiomediastinal silhouette. No acute osseous abnormality. IMPRESSION: Linear bibasilar opacities, likely atelectasis.  Hypoinflated lungs. Electronically Signed   By: Stana Bunting M.D.   On: 05/25/2020 19:40   CT Angio Chest PE W and/or Wo Contrast  Result Date: 05/26/2020 CLINICAL DATA:  Chest pain EXAM: CT  ANGIOGRAPHY CHEST WITH CONTRAST TECHNIQUE: Multidetector CT imaging of the chest was performed using the standard protocol during bolus administration of intravenous contrast. Multiplanar CT image reconstructions and MIPs were obtained to evaluate the vascular anatomy. CONTRAST:  OMNIPAQUE IOHEXOL 350 MG/ML SOLN COMPARISON:  None. FINDINGS: Cardiovascular: There is a optimal opacification of the pulmonary arteries. No central pulmonary embolism is seen. There is nonocclusive thrombus in the posterior left lower lobe subsegmental branches. There is also nonocclusive thrombus within the right middle lobe and posterior right lower lobe subsegmental arterial branches. There is mild cardiomegaly present. No pericardial effusion or thickening. No evidence right heart strain. There is normal three-vessel brachiocephalic anatomy without proximal stenosis. The thoracic aorta is normal in appearance. Mediastinum/Nodes: No hilar, mediastinal, or axillary adenopathy. Thyroid gland, trachea, and esophagus demonstrate no significant findings. Lungs/Pleura: Streaky patchy airspace opacity is seen at both lung bases with several air bronchograms within the right middle lobe. No pleural effusion or pneumothorax is seen. Upper Abdomen: No acute abnormalities present in the visualized portions of the upper abdomen. Musculoskeletal: No chest wall abnormality. No acute or significant osseous findings. Review of the MIP images confirms the above findings. IMPRESSION: 1. Nonocclusive thrombus in the posterior left lower lobe subsegmental branches as well as right middle lobe and posterior right lower lobe subsegmental arterial branches. No evidence of right ventricular heart strain. 2. Bibasilar streaky airspace opacities, which could be due to atelectasis. 3. Mild cardiomegaly Electronically Signed   By: Jonna Clark M.D.   On: 05/26/2020 01:44    EKG: Independently reviewed.  EKG shows normal sinus rhythm with nonspecific ST  changes.  QTc 420  Assessment/Plan Principal Problem:   Pulmonary embolism (HCC) Nancy Cobb will be placed on medical telemetry floor for observation with new pulmonary embolism.  Started on Eliquis for anticoagulation and first doses given in the emergency room.  Patient will need to be anticoagulated for at least 6 months and will be follow-up with her PCP after discharge.  Patient had knee replacement surgery on November 12 and has been taking aspirin since then and PE is most likely secondary to recent orthopedic surgery.  She does have a history of having a PE after surgery 20 years ago and was treated with Coumadin at that time.  Denies any personal or family history of bleeding or clotting disorders.  Active Problems:   Type 2 diabetes mellitus without complication (HCC) Patient with mild type 2 diabetes mellitus.  Continue Rybelus which she takes at home.  Patient reports he had a  hemoglobin A1c with her PCP a month ago so we will not repeat at this time    Essential hypertension Continue Avalide which is her home medication.  Monitor blood pressure    DVT prophylaxis: Nancy Cobb is started on Eliquis for anticoagulation.  Code Status:   Full code  Family Communication:  Diagnosis and plan discussed with patient anLaural Cobb her son who is at the bedside.  Questions were answered.  Further recommendations to follow as clinical indicated Disposition Plan:   Patient is from:  Home  Anticipated DC to:  Home  Anticipated DC date:  Anticipate less than 2 midnight stay in the hospital  Anticipated DC barriers: No barriers to discharge identified at this time  Admission status:  Observation   Carlton AdamBradley S Llewellyn Schoenberger MD [facility]  How to contact the [facility] Attending or Consulting provider 7A - 7P or covering provider during after hours 7P -7A, for this patient?   1. Check the care team in [facility] and look for a) attending/consulting [facility] provider listed and b) the [facility] team listed 2. Log into  [email or web address] and use [place]'s universal password to access. If you do not have the password, please contact the hospital operator. 3. Locate the [facility] provider you are looking for under [facility] and page to a number that you can be directly reached. 4. If you still have difficulty reaching the provider, please page the [facility] (Director on Call) for the Hospitalists listed on amion for assistance.  05/26/2020, 3:06 AM

## 2020-05-26 NOTE — Progress Notes (Addendum)
ANTICOAGULATION CONSULT NOTE - Initial Consult  Pharmacy Consult for heparin Indication: pulmonary embolus  No Known Allergies  Patient Measurements: Height: 5\' 5"  (165.1 cm) Weight: 93 kg (205 lb) IBW/kg (Calculated) : 57 Heparin Dosing Weight: 77.8 kg  Vital Signs: Temp: 98 F (36.7 C) (12/09 2254) Temp Source: Oral (12/09 2254) BP: 116/63 (12/10 0100) Pulse Rate: 77 (12/10 0100)  Labs: Recent Labs    05/25/20 1914 05/25/20 2135  HGB 12.9  --   HCT 39.8  --   PLT 297  --   CREATININE 0.99  --   TROPONINIHS <2 <2    Estimated Creatinine Clearance: 71.5 mL/min (by C-G formula based on SCr of 0.99 mg/dL).   Medical History: Past Medical History:  Diagnosis Date  . Hypertension     Medications:  See medication history  Assessment: 54 yo lady with h/o DVT and recent knee surgery to start heparin for PE.  Hg 12.9, PTLC 297  She was not on anticoagulation PTA Goal of Therapy:  Heparin level 0.3-0.7 units/ml Monitor platelets by anticoagulation protocol: Yes   Plan:  Heparin 5000 unit bolus and drip at 1300 units/hr Check heparin level ~6 hours after start Daily HL and CBC Monitor for bleeding complications  Seay, Lora Poteet 05/26/2020,1:57 AM   Addendum (0230): MD changing to Eliquis 10mg  po BID x 7 days then 5mg  po BID.  14/03/2020, PharmD, BCPS Please see amion for complete clinical pharmacist phone list 05/26/2020 2:26 AM

## 2020-05-26 NOTE — Discharge Instructions (Addendum)
Information on my medicine - ELIQUIS (apixaban)   Why was Eliquis prescribed for you? Eliquis was prescribed to treat blood clots that may have been found in the veins of your legs (deep vein thrombosis) or in your lungs (pulmonary embolism) and to reduce the risk of them occurring again.  What do You need to know about Eliquis ? The starting dose is 10 mg (two 5 mg tablets) taken TWICE daily for the FIRST SEVEN (7) DAYS, then on 06/01/2020 the dose is reduced to ONE 5 mg tablet taken TWICE daily.  Eliquis may be taken with or without food.   Try to take the dose about the same time in the morning and in the evening. If you have difficulty swallowing the tablet whole please discuss with your pharmacist how to take the medication safely.  Take Eliquis exactly as prescribed and DO NOT stop taking Eliquis without talking to the doctor who prescribed the medication.  Stopping may increase your risk of developing a new blood clot.  Refill your prescription before you run out.  After discharge, you should have regular check-up appointments with your healthcare provider that is prescribing your Eliquis.    What do you do if you miss a dose? If a dose of ELIQUIS is not taken at the scheduled time, take it as soon as possible on the same day and twice-daily administration should be resumed. The dose should not be doubled to make up for a missed dose.  Important Safety Information A possible side effect of Eliquis is bleeding. You should call your healthcare provider right away if you experience any of the following: ? Bleeding from an injury or your nose that does not stop. ? Unusual colored urine (red or dark brown) or unusual colored stools (red or black). ? Unusual bruising for unknown reasons. ? A serious fall or if you hit your head (even if there is no bleeding).  Some medicines may interact with Eliquis and might increase your risk of bleeding or clotting while on Eliquis. To help  avoid this, consult your healthcare provider or pharmacist prior to using any new prescription or non-prescription medications, including herbals, vitamins, non-steroidal anti-inflammatory drugs (NSAIDs) and supplements.  This website has more information on Eliquis (apixaban): http://www.eliquis.com/eliquis/home

## 2020-05-26 NOTE — Progress Notes (Addendum)
Bilateral lower extremity venous duplex completed. Refer to "CV Proc" under chart review to view preliminary results.  Preliminary results discussed with Dr. Sharolyn Douglas.  05/26/2020 9:13 AM Eula Fried., MHA, RVT, RDCS, RDMS

## 2020-05-26 NOTE — ED Notes (Signed)
Lunch Tray Ordered @ 1050. 

## 2020-05-26 NOTE — ED Provider Notes (Signed)
[facility] EMERGENCY DEPARTMENT Provider Note   CSN: [id number] Arrival date & time: 05/25/20  1850     History Chief Complaint  Patient presents with  . Chest Pain    Nancy Cobb is a 54 y.o. female.  Patient presents to the emergency department with a chief complaint of chest pain.  She states she developed the pain yesterday.  It is on the front right side of her chest and radiates to her back.  She reports associated shortness of breath and nausea.  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  She is not anticoagulated.  She denies fever, chills, cough.  She tried taking a muscle relaxer and and oxycodone with no relief.  The history is provided by the patient. No language interpreter was used.       Past Medical History:  Diagnosis Date  . Hypertension     There are no problems to display for this patient.   Past Surgical History:  Procedure Laterality Date  . ABDOMINAL HYSTERECTOMY    . APPENDECTOMY       OB History   No obstetric history on file.     No family history on file.  Social History   Tobacco Use  . Smoking status: Never Smoker  . Smokeless tobacco: Never Used  Substance Use Topics  . Alcohol use: No  . Drug use: No    Home Medications Prior to Admission medications   Medication Sig Start Date End Date Taking? Authorizing Provider  irbesartan-hydrochlorothiazide (AVALIDE) 150-12.5 MG tablet Take 1 tablet by mouth daily. 05/06/20  Yes [provider]  RYBELSUS 14 MG TABS Take 1 tablet by mouth every morning. 02/25/20  Yes [provider]  ASPIRIN ADULT LOW STRENGTH 81 MG EC tablet Take 81 mg by mouth 2 (two) times daily. 04/18/20   [provider]  HYDROcodone-acetaminophen (NORCO/VICODIN) 5-325 MG tablet Take 1 tablet by mouth every 4 (four) hours as needed. Patient taking differently: Take 1 tablet by mouth every 4 (four) hours as needed for  moderate pain. 07/02/18   Jacalyn Lefevre, MD  methocarbamol (ROBAXIN) 500 MG tablet Take 1 tablet (500 mg total) by mouth 2 (two) times daily as needed for muscle spasms. 03/11/17   Ward, Chase Picket, PA-C  naproxen (NAPROSYN) 500 MG tablet Take 1 tablet (500 mg total) by mouth 2 (two) times daily as needed for moderate pain. 03/11/17   Ward, Chase Picket, PA-C  oxyCODONE (OXY IR/ROXICODONE) 5 MG immediate release tablet Take 5 mg by mouth every 4 (four) hours. 05/09/20   [provider]  predniSONE (STERAPRED UNI-PAK 21 TAB) 10 MG (21) TBPK tablet Take 6 tabs for 2 days, then 5 for 2 days, then 4 for 2 days, then 3 for 2 days, 2 for 2 days, then 1 for 2 days 07/02/18   Jacalyn Lefevre, MD    Allergies    Patient has no known allergies.  Review of Systems   Review of Systems  All other systems reviewed and are negative.   Physical Exam Updated Vital Signs BP (!) 117/56 (BP Location: Right Arm)   Pulse 82   Temp 98 F (36.7 C) (Oral)   Resp (!) 24   Ht 5\' 5"  (1.651 m)   Wt 93 kg   SpO2 99%   BMI 34.11 kg/m   Physical Exam Vitals and nursing note reviewed.  Constitutional:      General: She  is not in acute distress.    Appearance: She is well-developed and well-nourished.  HENT:     Head: Normocephalic and atraumatic.  Eyes:     Conjunctiva/sclera: Conjunctivae normal.  Cardiovascular:     Rate and Rhythm: Normal rate and regular rhythm.     Heart sounds: No murmur heard.   Pulmonary:     Effort: Pulmonary effort is normal. No respiratory distress.     Breath sounds: Normal breath sounds.  Abdominal:     Palpations: Abdomen is soft.     Tenderness: There is no abdominal tenderness.  Musculoskeletal:        General: No edema. Normal range of motion.     Cervical back: Neck supple.  Skin:    General: Skin is warm and dry.  Neurological:     Mental Status: She is alert and oriented to person, place, and time.  Psychiatric:        Mood and Affect: Mood and  affect and mood normal.        Behavior: Behavior normal.     ED Results / Procedures / Treatments   Labs (all labs ordered are listed, but only abnormal results are displayed) Labs Reviewed  BASIC METABOLIC PANEL - Abnormal; Notable for the following components:      Result Value   Glucose, Bld 190 (*)    All other components within normal limits  CBC - Abnormal; Notable for the following components:   WBC 10.8 (*)    All other components within normal limits  D-DIMER, QUANTITATIVE (NOT AT [facility]) - Abnormal; Notable for the following components:   D-Dimer, Quant 5.38 (*)    All other components within normal limits  RESP PANEL BY RT-PCR (FLU A&B, COVID) ARPGX2  TROPONIN I (HIGH SENSITIVITY)  TROPONIN I (HIGH SENSITIVITY)    EKG None  Radiology DG Chest 2 View  Result Date: 05/25/2020 CLINICAL DATA:  cp EXAM: CHEST - 2 VIEW COMPARISON:  01/12/2018 FINDINGS: Hypoinflated lungs. Linear bibasilar opacities. No pneumothorax or pleural effusion. Stable cardiomediastinal silhouette. No acute osseous abnormality. IMPRESSION: Linear bibasilar opacities, likely atelectasis.  Hypoinflated lungs. Electronically Signed   By: Stana Bunting M.D.   On: 05/25/2020 19:40   CT Angio Chest PE W and/or Wo Contrast  Result Date: 05/26/2020 CLINICAL DATA:  Chest pain EXAM: CT ANGIOGRAPHY CHEST WITH CONTRAST TECHNIQUE: Multidetector CT imaging of the chest was performed using the standard protocol during bolus administration of intravenous contrast. Multiplanar CT image reconstructions and MIPs were obtained to evaluate the vascular anatomy. CONTRAST:  OMNIPAQUE IOHEXOL 350 MG/ML SOLN COMPARISON:  None. FINDINGS: Cardiovascular: There is a optimal opacification of the pulmonary arteries. No central pulmonary embolism is seen. There is nonocclusive thrombus in the posterior left lower lobe subsegmental branches. There is also nonocclusive thrombus within the right middle lobe and posterior  right lower lobe subsegmental arterial branches. There is mild cardiomegaly present. No pericardial effusion or thickening. No evidence right heart strain. There is normal three-vessel brachiocephalic anatomy without proximal stenosis. The thoracic aorta is normal in appearance. Mediastinum/Nodes: No hilar, mediastinal, or axillary adenopathy. Thyroid gland, trachea, and esophagus demonstrate no significant findings. Lungs/Pleura: Streaky patchy airspace opacity is seen at both lung bases with several air bronchograms within the right middle lobe. No pleural effusion or pneumothorax is seen. Upper Abdomen: No acute abnormalities present in the visualized portions of the upper abdomen. Musculoskeletal: No chest wall abnormality. No acute or significant osseous findings. Review of the MIP images confirms  the above findings. IMPRESSION: 1. Nonocclusive thrombus in the posterior left lower lobe subsegmental branches as well as right middle lobe and posterior right lower lobe subsegmental arterial branches. No evidence of right ventricular heart strain. 2. Bibasilar streaky airspace opacities, which could be due to atelectasis. 3. Mild cardiomegaly Electronically Signed   By: Jonna Clark M.D.   On: 05/26/2020 01:44    Procedures Procedures (including critical care time)  Medications Ordered in ED Medications - No data to display  ED Course  I have reviewed the triage vital signs and the nursing notes.  Pertinent labs & imaging results that were available during my care of the patient were reviewed by me and considered in my medical decision making (see chart for details).    MDM Rules/Calculators/A&P                          This patient complains of right sided chest pain and SBO, this involves an extensive number of treatment options, and is a complaint that carries with it a high risk of complications and morbidity.    Differential Dx ACS, PE, pneumonia, COVID  Pertinent Labs I ordered,  reviewed, and interpreted labs, which included troponins which are negative x2, D-dimer is elevated at 5.38, mild leukocytosis to 10.8, nonspecific  Imaging Interpretation I ordered imaging studies which included CT PE, which showed multiple subsegmental PEs.   Medications I ordered medication Eliquis for PE anticoagulation.  Critical Interventions  anticoagulation for PE  Reassessments After the interventions stated above, I reevaluated the patient and found still having some chest pain, and anxious about being discharged with PE.  Consultants Dr. Rachael Darby - Who is appreciated for observing the patient.  Plan Admit - obs    Final Clinical Impression(s) / ED Diagnoses Final diagnoses:  Multiple subsegmental pulmonary emboli without acute cor pulmonale [facility])    Rx / DC Orders ED Discharge Orders    None       Roxy Horseman, PA-C 05/26/20 0218    Melene Plan, DO 05/26/20 [phone]

## 2020-05-26 NOTE — ED Notes (Signed)
PIV D/C, AVS given to patient and son. Extensive teaching done by this RN and pharmacist re: eliquis and safety. Patient verbalized understanding. No further questions. DC home with all belongings.

## 2020-05-26 NOTE — TOC Benefit Eligibility Note (Signed)
Transition of Care [facility]) Benefit Eligibility Note    Patient Details  Name: Nancy Cobb MRN: [id number] Date of Birth: [birth year]   Medication/Dose: Everlene Balls   5 MG BID  Covered?: Yes  Tier:  (PREFERRED)  Prescription Coverage [facility]  Spoke with Person/Company/Phone Number:: CATHLEEN @  [facility] #  [phone] OPT- [employer]BER  Co-Pay: $142.96  Prior Approval: No  Deductible: Unmet       Mardene Sayer Phone Number: 05/26/2020, 2:04 PM

## 2020-05-26 NOTE — Progress Notes (Signed)
TOC CM referral received to check benefits for Eliquis. Eliquis initial dose is covered by the 30 day free trial card. Will send message to attending to send that Rx to [facility] pharmacy. They will deliver to her room and go over medications and side effects. TOC CM will provide pt with a Eliquis copay card that would allow her to get medications for $10 per month if she qualifies with her insurance payor. Benefits sent to [facility] In[employer] to check copay cost or coverage for Eliquis. Isidoro Donning RN CCM, [facility] ED TOC CM [phone]

## 2020-05-26 NOTE — ED Notes (Signed)
Patient ambulated the loop around the ED with her cane and was steady on her feet. Required no assistance. VSS and sats maintained 89-97%.

## 2020-05-26 NOTE — Progress Notes (Signed)
  Echocardiogram 2D Echocardiogram has been performed.  Delcie Roch 05/26/2020, 9:42 AM

## 2020-06-08 ENCOUNTER — Telehealth (HOSPITAL_COMMUNITY): Payer: Self-pay | Admitting: Pharmacist

## 2020-06-08 NOTE — Telephone Encounter (Signed)
Pharmacy Transitions of Care Follow-up Telephone Call  Date of discharge: 05/26/2020 Discharge Diagnosis: PE  How have you been since you were released from the hospital? Doing well  Medication changes made at discharge: Yes  Medication changes obtained and verified? Yes    Medication Accessibility:  Home Pharmacy: [facility]  Was the patient provided with refills on discharged medications? No, however she has had a post-discharge visit with Dr Parke Simmers and a prescription for Eliquis was sent in to [facility]  Have all prescriptions been transferred from [facility] to home pharmacy? N/A   Is the patient able to afford medications? Yes . Notable copays: Yes, $147 . Eligible patient assistance: Copay card to reduce to $10    Medication Review:  APIXABEN (ELIQUIS)  Apixaban 10 mg BID initiated on 05/26/20 Will switch to apixaban 5 mg BID after 7 days (06/02/2020).  - Discussed importance of taking medication around the same time everyday  - Reviewed potential DDIs with patient  - Advised patient of medications to avoid (NSAIDs, ASA)  - Educated that Tylenol (acetaminophen) will be the preferred analgesic to prevent risk of bleeding  - Emphasized importance of monitoring for signs and symptoms of bleeding (abnormal bruising, prolonged bleeding, nose bleeds, bleeding from gums, discolored urine, black tarry stools)  - Advised patient to alert all providers of anticoagulation therapy prior to starting a new medication or having a procedure    Follow-up Appointments:  PCP Hospital f/u appt was with Dr Parke Simmers  Dr Parke Simmers  f/u appt confirmed. Scheduled to see 07/04/2020.     Final Patient Assessment: So far, patient has had a good outcome and understands importance of continuing Eliquis.

## 2021-06-13 ENCOUNTER — Other Ambulatory Visit (HOSPITAL_COMMUNITY): Payer: Self-pay

## 2021-06-13 ENCOUNTER — Other Ambulatory Visit: Payer: Self-pay

## 2021-06-13 MED ORDER — OZEMPIC (2 MG/DOSE) 8 MG/3ML ~~LOC~~ SOPN
2.0000 mg | PEN_INJECTOR | SUBCUTANEOUS | 0 refills | Status: DC
  Filled 2021-06-13: qty 3, 28d supply, fill #0
  Filled 2021-07-05: qty 3, 28d supply, fill #1
  Filled 2021-08-03: qty 3, 28d supply, fill #2

## 2021-07-05 ENCOUNTER — Other Ambulatory Visit (HOSPITAL_COMMUNITY): Payer: Self-pay

## 2021-08-03 ENCOUNTER — Other Ambulatory Visit (HOSPITAL_COMMUNITY): Payer: Self-pay

## 2021-08-30 ENCOUNTER — Other Ambulatory Visit: Payer: Self-pay

## 2021-08-30 ENCOUNTER — Emergency Department (HOSPITAL_COMMUNITY)
Admission: EM | Admit: 2021-08-30 | Discharge: 2021-08-31 | Disposition: A | Discharge status: Home / Self Care | Payer: Managed Care, Other (non HMO) | Attending: Emergency Medicine | Admitting: Emergency Medicine

## 2021-08-30 ENCOUNTER — Encounter (HOSPITAL_COMMUNITY): Payer: Self-pay | Admitting: Emergency Medicine

## 2021-08-30 DIAGNOSIS — Z7901 Long term (current) use of anticoagulants: Secondary | ICD-10-CM | POA: Insufficient documentation

## 2021-08-30 DIAGNOSIS — Z79899 Other long term (current) drug therapy: Secondary | ICD-10-CM | POA: Diagnosis not present

## 2021-08-30 DIAGNOSIS — E119 Type 2 diabetes mellitus without complications: Secondary | ICD-10-CM | POA: Diagnosis not present

## 2021-08-30 DIAGNOSIS — L509 Urticaria, unspecified: Secondary | ICD-10-CM | POA: Insufficient documentation

## 2021-08-30 DIAGNOSIS — I10 Essential (primary) hypertension: Secondary | ICD-10-CM | POA: Insufficient documentation

## 2021-08-30 DIAGNOSIS — R21 Rash and other nonspecific skin eruption: Secondary | ICD-10-CM | POA: Diagnosis present

## 2021-08-30 MED ORDER — DIPHENHYDRAMINE HCL 25 MG PO CAPS
25.0000 mg | ORAL_CAPSULE | Freq: Once | ORAL | Status: AC
  Administered 2021-08-30: 25 mg via ORAL
  Filled 2021-08-30: qty 1

## 2021-08-30 MED ORDER — FAMOTIDINE 20 MG PO TABS
20.0000 mg | ORAL_TABLET | Freq: Once | ORAL | Status: AC
  Administered 2021-08-30: 20 mg via ORAL
  Filled 2021-08-30: qty 1

## 2021-08-30 MED ORDER — PREDNISONE 20 MG PO TABS
60.0000 mg | ORAL_TABLET | Freq: Once | ORAL | Status: AC
  Administered 2021-08-31: 60 mg via ORAL
  Filled 2021-08-30 (×2): qty 3

## 2021-08-30 NOTE — ED Provider Triage Note (Signed)
Emergency Medicine Provider Triage Evaluation Note ? ?Nancy Cobb , a 56 y.o. female  was evaluated in triage.  Pt complains of hives.  States started yesterday, seems to be getting worse today.  Rash mostly on torso and arms, none on legs.  Denies new soaps, detergents, or other personal care products.  No known allergies.  No meds taken PTA. ? ?Review of Systems  ?Positive: Rash/hives ?Negative: SOB ? ?Physical Exam  ?BP (!) 122/56 (BP Location: Right Arm)   Pulse 91   Temp 98.3 ?F (36.8 ?C) (Oral)   Resp 16   SpO2 100%  ?Gen:   Awake, no distress   ?Resp:  Normal effort  ?MSK:   Moves extremities without difficulty  ?Other:  Urticarial rash noted on torso, back, and arms.  No lesions on palms or soles, no superimposed infection or cellulitis ? ?Medical Decision Making  ?Medically screening exam initiated at 11:32 PM.  Appropriate orders placed.  Nancy Cobb was informed that the remainder of the evaluation will be completed by another provider, this initial triage assessment does not replace that evaluation, and the importance of remaining in the ED until their evaluation is complete. ? ?Hives.  Unclear origin.  Vitals are stable.  Prednisone, Benadryl, Pepcid. ?  ?Larene Pickett, PA-C ?08/30/21 2335 ? ?

## 2021-08-30 NOTE — ED Triage Notes (Signed)
Pt reported to ED with c/o "hives all over", that is spreading along torso and up neck and down arms. Pt states that onset started yesterday. Pt denies any changes in laundry detergent or soap. Denies any known allergies.  ?

## 2021-08-31 MED ORDER — PREDNISONE 20 MG PO TABS: ORAL_TABLET | ORAL | 0 refills | Status: DC

## 2021-08-31 MED ORDER — DIPHENHYDRAMINE HCL 25 MG PO CAPS: 25.0000 mg | ORAL_CAPSULE | Freq: Four times a day (QID) | ORAL | 0 refills | Status: DC | PRN

## 2021-08-31 MED ORDER — FAMOTIDINE 20 MG PO TABS: 20.0000 mg | ORAL_TABLET | Freq: Every day | ORAL | 0 refills | Status: DC

## 2021-08-31 MED ORDER — DIPHENHYDRAMINE HCL 25 MG PO TABS
25.0000 mg | ORAL_TABLET | Freq: Four times a day (QID) | ORAL | 0 refills | Status: DC | PRN
Start: 2021-08-31 — End: 2021-10-03

## 2021-08-31 NOTE — ED Provider Notes (Signed)
?[place] ?Provider Note ? ? ?CSN: [id number] ?Arrival date & time: 08/30/21  2302 ? ?  ? ?History ? ?Chief Complaint  ?Patient presents with  ? Rash  ? ? ?Nancy Cobb is a 56 y.o. female. ? ?The history is provided by the patient and medical records.  ? ?56 year old female with history of hypertension, diabetes, history of PE on Eliquis, presenting to the ED with rash.  States that started yesterday, has diffuse itching.  Rash mostly on torso, back, and arms, no rash on her legs.  She is unsure where this came from.  She denies any recent travel, new soaps or detergents, no new lotions or other personal care products.  Denies any known allergies.  No new medications.  No intervention tried prior to arrival. ? ?Home Medications ?Prior to Admission medications   ?Medication Sig Start Date End Date Taking? Authorizing Provider  ?diphenhydrAMINE (BENADRYL ALLERGY) 25 mg capsule Take 1 capsule (25 mg total) by mouth every 6 (six) hours as needed. 08/31/21  Yes Larene Pickett, PA-C  ?famotidine (PEPCID) 20 MG tablet Take 1 tablet (20 mg total) by mouth daily. 08/31/21  Yes Larene Pickett, PA-C  ?predniSONE (DELTASONE) 20 MG tablet Take 40 mg by mouth daily for 3 days, then $RemoveBe'20mg'yfMCxxrqf$  by mouth daily for 3 days, then $RemoveBe'10mg'JGrwJcqjz$  daily for 3 days 08/31/21  Yes Larene Pickett, PA-C  ?APIXABAN (ELIQUIS) VTE STARTER PACK ($RemoveBefor'10MG'OQDcMQFehukB$  AND $Re'5MG'SFy$ ) TAKE AS DIRECTED ON PACKAGE: START WITH TWO-$RemoveBefore'5MG'pnwArIYQmGnlH$  TABLETS TWICE DAILY FOR 7 DAYS. ON DAY 8, SWITCH TO ONE-$RemoveBefor'5MG'xWbpGNyhgiyS$  TABLET TWICE DAILY. 05/26/20 05/26/21  Alma Friendly, MD  ?Blood Pressure KIT 1 kit by Does not apply route in the morning and at bedtime. 05/26/20   Alma Friendly, MD  ?gabapentin (NEURONTIN) 100 MG capsule Take 100 mg by mouth every 8 (eight) hours. 04/18/20   [provider]  ?meloxicam (MOBIC) 15 MG tablet Take 15 mg by mouth daily. 01/24/20   [provider]  ?ondansetron (ZOFRAN) 4 MG tablet Take 4 mg by mouth every 8 (eight)  hours as needed for nausea or vomiting. 04/18/20   [provider]  ?pantoprazole (PROTONIX) 40 MG tablet TAKE 1 TABLET (40 MG TOTAL) BY MOUTH DAILY. 05/26/20 05/26/21  Alma Friendly, MD  ?RYBELSUS 14 MG TABS Take 1 tablet by mouth every morning. 02/25/20   [provider]  ?Semaglutide, 2 MG/DOSE, (OZEMPIC, 2 MG/DOSE,) 8 MG/3ML SOPN Inject 2 mg into the skin once a week. 06/13/21   Lucianne Lei, MD  ?tizanidine (ZANAFLEX) 2 MG capsule Take 2 mg by mouth every 8 (eight) hours. 04/18/20   [provider]  ?   ? ?Allergies    ?Patient has no known allergies.   ? ?Review of Systems   ?Review of Systems  ?Skin:  Positive for rash.  ?All other systems reviewed and are negative. ? ?Physical Exam ?Updated Vital Signs ?BP (!) 122/56 (BP Location: Right Arm)   Pulse 91   Temp 98.3 ?F (36.8 ?C) (Oral)   Resp 16   SpO2 100%  ? ?Physical Exam ?Vitals and nursing note reviewed.  ?Constitutional:   ?   Appearance: She is well-developed.  ?HENT:  ?   Head: Normocephalic and atraumatic.  ?   Mouth/Throat:  ?   Comments: No oral lesions, no lip/tongue swelling ?Eyes:  ?   Conjunctiva/sclera: Conjunctivae normal.  ?   Pupils: Pupils are equal, round, and reactive to light.  ?Cardiovascular:  ?  Rate and Rhythm: Normal rate and regular rhythm.  ?   Heart sounds: Normal heart sounds.  ?Pulmonary:  ?   Effort: Pulmonary effort is normal.  ?   Breath sounds: Normal breath sounds.  ?Abdominal:  ?   General: Bowel sounds are normal.  ?   Palpations: Abdomen is soft.  ?Musculoskeletal:     ?   General: Normal range of motion.  ?   Cervical back: Normal range of motion.  ?Skin: ?   General: Skin is warm and dry.  ?   Comments: Dissipating urticarial rash to torso and trunk (compared with triage evaluation earlier tonight), no lesions on palms/soles  ?Neurological:  ?   Mental Status: She is alert and oriented to person, place, and time.  ? ? ?ED Results / Procedures / Treatments   ?Labs ?(all labs ordered  are listed, but only abnormal results are displayed) ?Labs Reviewed - No data to display ? ?EKG ?None ? ?Radiology ?No results found. ? ?Procedures ?Procedures  ? ? ?Medications Ordered in ED ?Medications  ?predniSONE (DELTASONE) tablet 60 mg (60 mg Oral Given 08/31/21 0122)  ?diphenhydrAMINE (BENADRYL) capsule 25 mg (25 mg Oral Given 08/30/21 2342)  ?famotidine (PEPCID) tablet 20 mg (20 mg Oral Given 08/30/21 2342)  ? ? ?ED Course/ Medical Decision Making/ A&P ?  ?                        ?Medical Decision Making ?Risk ?Prescription drug management. ? ? ?56 year old female here with urticarial rash.  I initially evaluated her in triage and treated with prednisone, Benadryl, and Pepcid.  Rash seems to be dissipating.  She remains without any lip or tongue swelling, no airway compromise.  Unclear etiology.  Still cannot recall anything that is new that may have triggered this.  We will have her continue Benadryl and prednisone for the next few days, follow-up with PCP.  Return here for any new or acute changes. ? ?Final Clinical Impression(s) / ED Diagnoses ?Final diagnoses:  ?Urticaria  ? ? ?Rx / DC Orders ?ED Discharge Orders   ? ?      Ordered  ?  predniSONE (DELTASONE) 20 MG tablet       ? 08/31/21 0327  ?  diphenhydrAMINE (BENADRYL ALLERGY) 25 mg capsule  Every 6 hours PRN       ? 08/31/21 0327  ?  famotidine (PEPCID) 20 MG tablet  Daily       ? 08/31/21 0327  ? ?  ?  ? ?  ? ? ?  ?Larene Pickett, PA-C ?08/31/21 1886 ? ?  ?Veryl Speak, MD ?08/31/21 [phone] ? ?

## 2021-08-31 NOTE — Discharge Instructions (Signed)
Take the prescribed medication as directed. °Follow-up with your primary care doctor. °Return to the ED for new or worsening symptoms. °

## 2021-09-14 ENCOUNTER — Observation Stay (HOSPITAL_BASED_OUTPATIENT_CLINIC_OR_DEPARTMENT_OTHER): Payer: Managed Care, Other (non HMO)

## 2021-09-14 ENCOUNTER — Emergency Department (HOSPITAL_COMMUNITY): Payer: Managed Care, Other (non HMO)

## 2021-09-14 ENCOUNTER — Observation Stay (HOSPITAL_COMMUNITY)
Admission: EM | Admit: 2021-09-14 | Discharge: 2021-09-15 | Disposition: A | Discharge status: Home / Self Care | Payer: Managed Care, Other (non HMO) | Attending: Internal Medicine | Admitting: Internal Medicine

## 2021-09-14 ENCOUNTER — Encounter (HOSPITAL_COMMUNITY): Payer: Self-pay | Admitting: Emergency Medicine

## 2021-09-14 ENCOUNTER — Other Ambulatory Visit: Payer: Self-pay

## 2021-09-14 DIAGNOSIS — I824Y1 Acute embolism and thrombosis of unspecified deep veins of right proximal lower extremity: Secondary | ICD-10-CM

## 2021-09-14 DIAGNOSIS — R21 Rash and other nonspecific skin eruption: Secondary | ICD-10-CM | POA: Diagnosis present

## 2021-09-14 DIAGNOSIS — R0789 Other chest pain: Secondary | ICD-10-CM | POA: Diagnosis present

## 2021-09-14 DIAGNOSIS — E041 Nontoxic single thyroid nodule: Secondary | ICD-10-CM | POA: Diagnosis not present

## 2021-09-14 DIAGNOSIS — Z86718 Personal history of other venous thrombosis and embolism: Secondary | ICD-10-CM

## 2021-09-14 DIAGNOSIS — Z79899 Other long term (current) drug therapy: Secondary | ICD-10-CM | POA: Diagnosis not present

## 2021-09-14 DIAGNOSIS — I2699 Other pulmonary embolism without acute cor pulmonale: Secondary | ICD-10-CM

## 2021-09-14 DIAGNOSIS — Z86711 Personal history of pulmonary embolism: Secondary | ICD-10-CM | POA: Diagnosis not present

## 2021-09-14 DIAGNOSIS — D72829 Elevated white blood cell count, unspecified: Secondary | ICD-10-CM | POA: Diagnosis not present

## 2021-09-14 DIAGNOSIS — J189 Pneumonia, unspecified organism: Secondary | ICD-10-CM | POA: Diagnosis not present

## 2021-09-14 DIAGNOSIS — R079 Chest pain, unspecified: Secondary | ICD-10-CM

## 2021-09-14 DIAGNOSIS — E119 Type 2 diabetes mellitus without complications: Secondary | ICD-10-CM | POA: Diagnosis not present

## 2021-09-14 DIAGNOSIS — Z794 Long term (current) use of insulin: Secondary | ICD-10-CM | POA: Diagnosis not present

## 2021-09-14 DIAGNOSIS — I10 Essential (primary) hypertension: Secondary | ICD-10-CM | POA: Diagnosis not present

## 2021-09-14 LAB — CBC WITH DIFFERENTIAL/PLATELET
Abs Immature Granulocytes: 0.06 10*3/uL (ref 0.00–0.07)
Basophils Absolute: 0 10*3/uL (ref 0.0–0.1)
Basophils Relative: 0 %
Eosinophils Absolute: 0.1 10*3/uL (ref 0.0–0.5)
Eosinophils Relative: 1 %
HCT: 36.9 % (ref 36.0–46.0)
Hemoglobin: 12.2 g/dL (ref 12.0–15.0)
Immature Granulocytes: 1 %
Lymphocytes Relative: 18 %
Lymphs Abs: 2 10*3/uL (ref 0.7–4.0)
MCH: 27.7 pg (ref 26.0–34.0)
MCHC: 33.1 g/dL (ref 30.0–36.0)
MCV: 83.7 fL (ref 80.0–100.0)
Monocytes Absolute: 0.5 10*3/uL (ref 0.1–1.0)
Monocytes Relative: 5 %
Neutro Abs: 8 10*3/uL — ABNORMAL HIGH (ref 1.7–7.7)
Neutrophils Relative %: 75 %
Platelets: 272 10*3/uL (ref 150–400)
RBC: 4.41 MIL/uL (ref 3.87–5.11)
RDW: 14 % (ref 11.5–15.5)
WBC: 10.7 10*3/uL — ABNORMAL HIGH (ref 4.0–10.5)
nRBC: 0 % (ref 0.0–0.2)

## 2021-09-14 LAB — BASIC METABOLIC PANEL
Anion gap: 7 (ref 5–15)
BUN: 13 mg/dL (ref 6–20)
CO2: 26 mmol/L (ref 22–32)
Calcium: 8.9 mg/dL (ref 8.9–10.3)
Chloride: 102 mmol/L (ref 98–111)
Creatinine, Ser: 1.03 mg/dL — ABNORMAL HIGH (ref 0.44–1.00)
GFR, Estimated: >60 mL/min (ref >60)
Glucose, Bld: 135 mg/dL — ABNORMAL HIGH (ref 70–99)
Potassium: 4.1 mmol/L (ref 3.5–5.1)
Sodium: 135 mmol/L (ref 135–145)

## 2021-09-14 LAB — TROPONIN I (HIGH SENSITIVITY)
Troponin I (High Sensitivity): 5 ng/L (ref <18)
Troponin I (High Sensitivity): 3 ng/L (ref <18)

## 2021-09-14 LAB — ECHOCARDIOGRAM COMPLETE
AR max vel: 2.75 cm2
AV Peak grad: 10.1 mmHg
Ao pk vel: 1.59 m/s
Area-P 1/2: 3.76 cm2
Calc EF: 64.9 %
Height: 65 in
S' Lateral: 2.8 cm
Single Plane A2C EF: 66.6 %
Single Plane A4C EF: 63.5 %
Weight: 3680 oz

## 2021-09-14 LAB — HEPATIC FUNCTION PANEL
ALT: 24 U/L (ref 0–44)
AST: 19 U/L (ref 15–41)
Albumin: 3 g/dL — ABNORMAL LOW (ref 3.5–5.0)
Alkaline Phosphatase: 58 U/L (ref 38–126)
Bilirubin, Direct: <0.1 mg/dL (ref 0.0–0.2)
Total Bilirubin: 0.4 mg/dL (ref 0.3–1.2)
Total Protein: 7.2 g/dL (ref 6.5–8.1)

## 2021-09-14 LAB — HIV ANTIBODY (ROUTINE TESTING W REFLEX): HIV Screen 4th Generation wRfx: NONREACTIVE

## 2021-09-14 LAB — HEPARIN LEVEL (UNFRACTIONATED): Heparin Unfractionated: 0.27 IU/mL — ABNORMAL LOW (ref 0.30–0.70)

## 2021-09-14 LAB — LIPASE, BLOOD: Lipase: 35 U/L (ref 11–51)

## 2021-09-14 LAB — TSH: TSH: 2.887 u[IU]/mL (ref 0.350–4.500)

## 2021-09-14 LAB — PROCALCITONIN: Procalcitonin: 0.3 ng/mL

## 2021-09-14 LAB — BRAIN NATRIURETIC PEPTIDE: B Natriuretic Peptide: 15.4 pg/mL (ref 0.0–100.0)

## 2021-09-14 MED ORDER — APIXABAN 5 MG PO TABS: 10.0000 mg | ORAL_TABLET | Freq: Two times a day (BID) | ORAL | Status: DC

## 2021-09-14 MED ORDER — POLYETHYLENE GLYCOL 3350 17 G PO PACK
17.0000 g | PACK | Freq: Every day | ORAL | Status: DC | PRN
  Administered 2021-09-15: 17 g via ORAL

## 2021-09-14 MED ORDER — HEPARIN (PORCINE) 25000 UT/250ML-% IV SOLN
1400.0000 [IU]/h | INTRAVENOUS | Status: DC
  Administered 2021-09-14: 1400 [IU]/h via INTRAVENOUS
  Filled 2021-09-14: qty 250

## 2021-09-14 MED ORDER — APIXABAN 5 MG PO TABS: 5.0000 mg | ORAL_TABLET | Freq: Two times a day (BID) | ORAL | Status: DC

## 2021-09-14 MED ORDER — ACETAMINOPHEN 650 MG RE SUPP: 650.0000 mg | Freq: Four times a day (QID) | RECTAL | Status: DC | PRN

## 2021-09-14 MED ORDER — ALUM & MAG HYDROXIDE-SIMETH 200-200-20 MG/5ML PO SUSP
30.0000 mL | Freq: Four times a day (QID) | ORAL | Status: DC | PRN
  Administered 2021-09-15: 30 mL via ORAL

## 2021-09-14 MED ORDER — DIPHENHYDRAMINE HCL 25 MG PO CAPS
25.0000 mg | ORAL_CAPSULE | Freq: Four times a day (QID) | ORAL | Status: DC | PRN
  Administered 2021-09-14: 25 mg via ORAL
  Filled 2021-09-14: qty 1

## 2021-09-14 MED ORDER — SODIUM CHLORIDE 0.9 % IV SOLN: Freq: Once | INTRAVENOUS | Status: AC

## 2021-09-14 MED ORDER — PREDNISONE 20 MG PO TABS: 20.0000 mg | ORAL_TABLET | ORAL | Status: DC

## 2021-09-14 MED ORDER — APIXABAN 5 MG PO TABS
10.0000 mg | ORAL_TABLET | Freq: Once | ORAL | Status: AC
  Administered 2021-09-14: 10 mg via ORAL
  Filled 2021-09-14: qty 2

## 2021-09-14 MED ORDER — SODIUM CHLORIDE 0.9 % IV SOLN
500.0000 mg | INTRAVENOUS | Status: DC
  Administered 2021-09-14: 500 mg via INTRAVENOUS
  Filled 2021-09-14 (×4): qty 5

## 2021-09-14 MED ORDER — DIPHENHYDRAMINE HCL 25 MG PO CAPS
25.0000 mg | ORAL_CAPSULE | Freq: Four times a day (QID) | ORAL | Status: AC | PRN
  Administered 2021-09-14: 25 mg via ORAL
  Filled 2021-09-14: qty 1

## 2021-09-14 MED ORDER — KETOROLAC TROMETHAMINE 15 MG/ML IJ SOLN
15.0000 mg | Freq: Once | INTRAMUSCULAR | Status: AC
  Administered 2021-09-14: 15 mg via INTRAVENOUS
  Filled 2021-09-14: qty 1

## 2021-09-14 MED ORDER — SODIUM CHLORIDE 0.9 % IV SOLN
2.0000 g | INTRAVENOUS | Status: DC
  Filled 2021-09-14: qty 20

## 2021-09-14 MED ORDER — IOHEXOL 350 MG/ML SOLN
80.0000 mL | Freq: Once | INTRAVENOUS | Status: AC | PRN
  Administered 2021-09-14: 80 mL via INTRAVENOUS

## 2021-09-14 MED ORDER — HEPARIN BOLUS VIA INFUSION
6000.0000 [IU] | Freq: Once | INTRAVENOUS | Status: AC
  Administered 2021-09-14: 6000 [IU] via INTRAVENOUS
  Filled 2021-09-14: qty 6000

## 2021-09-14 MED ORDER — SODIUM CHLORIDE 0.9 % IV SOLN
2.0000 g | Freq: Once | INTRAVENOUS | Status: AC
  Administered 2021-09-14: 2 g via INTRAVENOUS
  Filled 2021-09-14: qty 20

## 2021-09-14 MED ORDER — SENNOSIDES-DOCUSATE SODIUM 8.6-50 MG PO TABS: 1.0000 | ORAL_TABLET | Freq: Every evening | ORAL | Status: DC | PRN

## 2021-09-14 MED ORDER — SODIUM CHLORIDE 0.9% FLUSH
3.0000 mL | Freq: Two times a day (BID) | INTRAVENOUS | Status: DC
  Administered 2021-09-14 – 2021-09-15 (×3): 3 mL via INTRAVENOUS

## 2021-09-14 MED ORDER — OXYCODONE HCL 5 MG PO TABS
5.0000 mg | ORAL_TABLET | ORAL | Status: DC | PRN
  Administered 2021-09-14 – 2021-09-15 (×3): 5 mg via ORAL
  Filled 2021-09-14 (×3): qty 1

## 2021-09-14 MED ORDER — SODIUM CHLORIDE 0.9 % IV BOLUS
500.0000 mL | Freq: Once | INTRAVENOUS | Status: AC
  Administered 2021-09-14: 500 mL via INTRAVENOUS

## 2021-09-14 MED ORDER — APIXABAN 5 MG PO TABS
5.0000 mg | ORAL_TABLET | Freq: Two times a day (BID) | ORAL | Status: DC
Start: 2021-09-21 — End: 2021-09-15

## 2021-09-14 MED ORDER — APIXABAN 5 MG PO TABS
10.0000 mg | ORAL_TABLET | Freq: Two times a day (BID) | ORAL | Status: DC
  Administered 2021-09-14 – 2021-09-15 (×2): 10 mg via ORAL
  Filled 2021-09-14 (×2): qty 2

## 2021-09-14 MED ORDER — ALBUTEROL SULFATE (2.5 MG/3ML) 0.083% IN NEBU: 2.5000 mg | INHALATION_SOLUTION | RESPIRATORY_TRACT | Status: DC | PRN

## 2021-09-14 MED ORDER — SODIUM CHLORIDE 0.9 % IV SOLN: 1.0000 g | INTRAVENOUS | Status: DC

## 2021-09-14 MED ORDER — HYDROMORPHONE HCL 1 MG/ML IJ SOLN
1.0000 mg | Freq: Once | INTRAMUSCULAR | Status: AC
  Administered 2021-09-14: 1 mg via INTRAVENOUS
  Filled 2021-09-14: qty 1

## 2021-09-14 MED ORDER — ACETAMINOPHEN 325 MG PO TABS
650.0000 mg | ORAL_TABLET | Freq: Four times a day (QID) | ORAL | Status: DC | PRN
  Administered 2021-09-14: 650 mg via ORAL
  Filled 2021-09-14: qty 2

## 2021-09-14 MED ORDER — ALBUTEROL SULFATE (2.5 MG/3ML) 0.083% IN NEBU
2.5000 mg | INHALATION_SOLUTION | Freq: Once | RESPIRATORY_TRACT | Status: AC
  Administered 2021-09-14: 2.5 mg via RESPIRATORY_TRACT
  Filled 2021-09-14: qty 3

## 2021-09-14 NOTE — Progress Notes (Signed)
Lower extremity venous has been completed.  ? ?Preliminary results in CV Proc.  ? ?Nancy Cobb ?09/14/2021 3:15 PM    ?

## 2021-09-14 NOTE — ED Provider Notes (Signed)
?[facility]Y DEPT ?[facility] Emergency Department ?Provider Note ?MRN:  [id number]  ?Arrival date & time: 09/14/21    ? ?Chief Complaint   ?Chest Pain ?  ?History of Present Illness   ?Nancy Cobb is a 56 y.o. year-old female with a history of hypertension diabetes presenting to the ED with chief complaint of chest pain. ? ?Pain to the left side of the chest, worse with deep breaths.  Pain has also began to occur in the left upper quadrant, much worse with palpation.  No nausea vomiting, no diarrhea. ? ?Review of Systems  ?A thorough review of systems was obtained and all systems are negative except as noted in the HPI and PMH.  ? ?Patient's Health History   ? ?Past Medical History:  ?Diagnosis Date  ? Arthritis   ? Diabetes mellitus without complication (HCC)   ? Hypertension   ?  ?Past Surgical History:  ?Procedure Laterality Date  ? ABDOMINAL HYSTERECTOMY    ? APPENDECTOMY    ?  ?No family history on file.  ?Social History  ? ?Socioeconomic History  ? Marital status: Single  ?  Spouse name: Not on file  ? Number of children: Not on file  ? Years of education: Not on file  ? Highest education level: Not on file  ?Occupational History  ? Not on file  ?Tobacco Use  ? Smoking status: Never  ? Smokeless tobacco: Never  ?Substance and Sexual Activity  ? Alcohol use: No  ? Drug use: No  ? Sexual activity: Not on file  ?Other Topics Concern  ? Not on file  ?Social History Narrative  ? Not on file  ? ?Social Determinants of Health  ? ?Financial Resource Strain: Not on file  ?Food Insecurity: Not on file  ?Transportation Needs: Not on file  ?Physical Activity: Not on file  ?Stress: Not on file  ?Social Connections: Not on file  ?Intimate Partner Violence: Not on file  ?  ? ?Physical Exam  ? ?Vitals:  ? 09/14/21 0606 09/14/21 0717  ?BP: (!) 116/59 111/71  ?Pulse: 81 84  ?Resp: (!) 22 (!) 27  ?Temp:  98.5 ?F (36.9 ?C)  ?SpO2: 95% 93%  ?  ?CONSTITUTIONAL: Well-appearing, NAD ?NEURO/PSYCH:  Alert and oriented x 3,  no focal deficits ?EYES:  eyes equal and reactive ?ENT/NECK:  no LAD, no JVD ?CARDIO: Regular rate, well-perfused, normal S1 and S2 ?PULM:  CTAB no wheezing or rhonchi ?GI/GU:  non-distended, moderate left upper quadrant tenderness to palpation ?MSK/SPINE:  No gross deformities, no edema ?SKIN:  no rash, atraumatic ? ? ?*Additional and/or pertinent findings included in MDM below ? ?Diagnostic and Interventional Summary  ? ? EKG Interpretation ? ?Date/Time:  Friday September 14 2021 02:45:36 EDT ?Ventricular Rate:  89 ?PR Interval:  106 ?QRS Duration: 84 ?QT Interval:  326 ?QTC Calculation: 396 ?R Axis:   32 ?Text Interpretation: Sinus rhythm with short PR Otherwise normal ECG When compared with ECG of 26-May-2020 00:00, PREVIOUS ECG IS PRESENT Confirmed by Kennis Carina [phone]) on 09/14/2021 7:20:39 AM ?  ? ?  ? ?Labs Reviewed  ?CBC WITH DIFFERENTIAL/PLATELET - Abnormal; Notable for the following components:  ?    Result Value  ? WBC 10.7 (*)   ? Neutro Abs 8.0 (*)   ? All other components within normal limits  ?BASIC METABOLIC PANEL - Abnormal; Notable for the following components:  ? Glucose, Bld 135 (*)   ? Creatinine, Ser 1.03 (*)   ? All other components within  normal limits  ?HEPATIC FUNCTION PANEL - Abnormal; Notable for the following components:  ? Albumin 3.0 (*)   ? All other components within normal limits  ?BRAIN NATRIURETIC PEPTIDE  ?LIPASE, BLOOD  ?TROPONIN I (HIGH SENSITIVITY)  ?TROPONIN I (HIGH SENSITIVITY)  ?  ?CT ABDOMEN PELVIS W CONTRAST  ?Final Result  ?  ?CT Angio Chest Pulmonary Embolism (PE) W or WO Contrast  ?Final Result  ?  ?DG Chest 2 View  ?Final Result  ?  ?  ?Medications  ?HYDROmorphone (DILAUDID) injection 1 mg (has no administration in time range)  ?ketorolac (TORADOL) 15 MG/ML injection 15 mg (has no administration in time range)  ?cefTRIAXone (ROCEPHIN) 2 g in sodium chloride 0.9 % 100 mL IVPB (has no administration in time range)  ?azithromycin (ZITHROMAX) 500 mg in sodium chloride 0.9  % 250 mL IVPB (has no administration in time range)  ?HYDROmorphone (DILAUDID) injection 1 mg (1 mg Intravenous Given 09/14/21 0536)  ?iohexol (OMNIPAQUE) 350 MG/ML injection 80 mL (80 mLs Intravenous Contrast Given 09/14/21 0607)  ?  ? ?Procedures  /  Critical Care ?Procedures ? ?ED Course and Medical Decision Making  ?Initial Impression and Ddx ?Differential diagnosis includes PE, splenic thrombus, gastritis, pancreatitis.  Awaiting CT imaging. ? ?Past medical/surgical history that increases complexity of ED encounter: PE ? ?Interpretation of Diagnostics ?I personally reviewed the EKG and my interpretation is as follows: Sinus rhythm ?   ?Labs do not reveal any significant blood count or electrolyte disturbance on CBC/CMP.  Troponin is negative.  CT revealing PE of unclear chronicity, there is a new area in the left lung that could be pulmonary infarct.  This is location of patient's pain. ? ?Patient Reassessment and Ultimate Disposition/Management ?Patient with continued severe pain, will admit to medicine. ? ?Patient management required discussion with the following services or consulting groups:  Hospitalist Service ? ?Complexity of Problems Addressed ?Acute illness or injury that poses threat of life of bodily function ? ?Additional Data Reviewed and Analyzed ?Further history obtained from: ?Past medical history and medications listed in the EMR ? ?Additional Factors Impacting ED Encounter Risk ?Consideration of hospitalization ? ?Elmer Sow. Pilar Plate, MD ?[facility] Emergency Medicine ?[facility] [email or web address] ? ?Final Clinical Impressions(s) / ED Diagnoses  ? ?  ICD-10-CM   ?1. Chest pain, unspecified type  R07.9   ?  ?  ?ED Discharge Orders   ? ? None  ? ?  ?  ? ?Discharge Instructions Discussed with and Provided to Patient:  ? ?Discharge Instructions   ?None ?  ? ?  ?Sabas Sous, MD ?09/14/21 [phone] ? ?

## 2021-09-14 NOTE — Assessment & Plan Note (Signed)
Acute.  WBC elevated 10.7.  Suspect secondary to above. ?

## 2021-09-14 NOTE — ED Notes (Signed)
Hives noted on arms and chest of pt while administering meds.  Has been occurring periodically since last Wed. Pt states hives were present when she came to hospital and were getting better but now they are getting better. ?

## 2021-09-14 NOTE — ED Notes (Signed)
Lab to run lipase and hepatic func panel ° °

## 2021-09-14 NOTE — Assessment & Plan Note (Signed)
Patient was noted to have this rash of her upper body that started 2 weeks ago.  She had been started on a prednisone taper which she states helped make the rash looks less red in appearance. ?-Recommend outpatient follow-up with dermatology ?

## 2021-09-14 NOTE — Progress Notes (Signed)
ANTICOAGULATION CONSULT NOTE - Initial Consult ? ?Pharmacy Consult for Heparin infusion ?Indication: pulmonary embolus ? ?No Known Allergies ? ?Patient Measurements: ?Height: 5\' 5"  (165.1 cm) ?Weight: 104.3 kg (230 lb) ?IBW/kg (Calculated) : 57 ?Heparin Dosing Weight: 81.2 kg ? ?Vital Signs: ?Temp: 98.5 ?F (36.9 ?C) (03/31 [id number]) ?Temp Source: Oral (03/31 0717) ?BP: 101/63 (03/31 0745) ?Pulse Rate: 86 (03/31 0745) ? ?Labs: ?Recent Labs  ?  09/14/21 ?0307 09/14/21 ?0446  ?HGB 12.2  --   ?HCT 36.9  --   ?PLT 272  --   ?CREATININE 1.03*  --   ?TROPONINIHS 5 3  ? ? ?Estimated Creatinine Clearance: 72.2 mL/min (A) (by C-G formula based on SCr of 1.03 mg/dL (H)). ? ? ?Medical History: ?Past Medical History:  ?Diagnosis Date  ? Arthritis   ? Diabetes mellitus without complication ([place])   ? Hypertension   ? ? ?Medications:  ?(Not in a hospital admission)  ? ?Assessment: ?56 yo F admitted for L-sided chest pain and found on CT PE to have pulmonary emboli. Pt was not on any anticoagulants prior to admission. No s/sx of bleeding. Pharmacy was consulted to dose heparin infusion for PE.  ? ?Goal of Therapy:  ?Heparin level 0.3-0.7 units/ml ?Monitor platelets by anticoagulation protocol: Yes ?  ?Plan:  ?Give 6000 units bolus x 1 ?Start heparin infusion at 1400 units/hr ?Check heparin level in 6 hrs and repeat until heparin level in therapeutic range x2, then decrease to daily heparin levels. ?Monitor daily CBC and s/sx of bleeding. ? ?Kaleen Mask ?09/14/2021,7:57 AM ? ? ?

## 2021-09-14 NOTE — H&P (Addendum)
?History and Physical  ? ? ?Patient: Nancy Cobb [id number] DOB: [birth year] ?DOA: 09/14/2021 ?DOS: the patient was seen and examined on 09/14/2021 ?PCP: Lucianne Lei, MD  ?Patient coming from: Home ? ?Chief Complaint:  ?Chief Complaint  ?Patient presents with  ? Chest Pain  ? ?HPI: Nancy Cobb is a 56 y.o. female with medical history significant of DVT/PE following surgeries who presents with complaints of chest pain over the last 2 days.  Patient reports left upper sided chest pain that worsen whenever she tried to take a deep inspiratory breath.  Noted associated symptoms of left lower chest pain, palpitations, right leg swelling, and fatigue.  She had recently evaluated in the ED last week for a rash of her upper body that she reported did not itch and was treated with a prednisone taper.  Since being on the medicine she reports that the rash had improved and was not as red in appearance.  Denied having any significant fever, cough, nausea, or vomiting.  Patient reports prior history of DVT and pulmonary embolus back in 05/2020 after left knee surgery.  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  She had been on Eliquis for 6 months and then it was discontinued as symptoms were thought to be provoked.  She reports that anywhere from 20-25 years ago she had been diagnosed with a DVT and pulmonary embolus after having some surgical procedure as well.  She denies any recent history of prolonged immobilization, travel, or known malignancy. ? ?Upon admission to the emergency department patient noted to be afebrile with respirations 17-32, and all other vital signs maintained.  Labs significant for WBC 10.7.  Chest x-ray noted cardiomegaly with low lung volumes. CTA of the chest noted a right lower lobe pulmonary emboli thought to be chronic rather than acute and similar distribution to prior  clot seen in 2021 with left lower lobe opacity concerning for pulmonary infarct or acute left lower lobe pneumonia. Patient had been given pain ketorolac IV, Dilaudid IV, Rocephin, and azithromycin. ? ? ? ?Review of Systems: As mentioned in the history of present illness. All other systems reviewed and are negative. ?Past Medical History:  ?Diagnosis Date  ? Arthritis   ? Diabetes mellitus without complication ([place])   ? Hypertension   ? ?Past Surgical History:  ?Procedure Laterality Date  ? ABDOMINAL HYSTERECTOMY    ? APPENDECTOMY    ? ?Social History:  reports that she has never smoked. She has never used smokeless tobacco. She reports that she does not drink alcohol and does not use drugs. ? ?No Known Allergies ? ?No family history on file. ? ?Prior to Admission medications   ?Medication Sig Start Date End Date Taking? Authorizing Provider  ?diphenhydrAMINE (BENADRYL) 25 MG tablet Take 1 tablet (25 mg total) by mouth every 6 (six) hours as needed. 08/31/21  Yes Larene Pickett, PA-C  ?predniSONE (DELTASONE) 20 MG tablet Take 40 mg by mouth daily for 3 days, then 20mg  by mouth daily for 3 days, then 10mg  daily for 3 days ?Patient taking differently: Take 20 mg by mouth See admin instructions. 40 mg x 3 days ?20 mg x 3 days ?10 mg x 3 days 08/31/21  Yes Larene Pickett, PA-C  ?famotidine (PEPCID) 20 MG tablet Take 1 tablet (20 mg total) by mouth daily. ?Patient not taking: Reported on 09/14/2021 08/31/21  Larene Pickett, PA-C  ?Semaglutide, 2 MG/DOSE, (OZEMPIC, 2 MG/DOSE,) 8 MG/3ML SOPN Inject 2 mg into the skin once a week. ?Patient not taking: Reported on 09/14/2021 06/13/21   Lucianne Lei, MD  ? ? ?Physical Exam: ?Vitals:  ? 09/14/21 0500 09/14/21 0606 09/14/21 0717 09/14/21 0745  ?BP:  (!) 116/59 111/71 101/63  ?Pulse: 89 81 84 86  ?Resp: (!) 24 (!) 22 (!) 27 (!) 21  ?Temp:   98.5 ?F (36.9 ?C)   ?TempSrc:   Oral   ?SpO2: 100% 95% 93% 100%  ?Weight:      ?Height:      ? ? ?Constitutional: Middle-aged female  currently in some discomfort ?Eyes:lids and conjunctivae normal ?ENMT: Mucous membranes are moist. Posterior pharynx clear of any exudate or lesions.  ?Neck: normal, supple, no masses, no thyromegaly ?Respiratory: Mildly tachypneic with mild expiratory wheeze appreciated infarction versus pneumonia. ?Cardiovascular: Regular rate and rhythm, no murmurs / rubs / gallops. No extremity edema. 2+ pedal pulses. No carotid bruits.  ?Abdomen: no tenderness, no masses palpated. No hepatosplenomegaly. Bowel sounds positive.  ?Musculoskeletal: no clubbing / cyanosis. No joint deformity upper and lower extremities. Good ROM, no contractures. Normal muscle tone.  ?Skin: no rashes, lesions, ulcers. No induration ?Neurologic: CN 2-12 grossly intact. Sensation intact, DTR normal. Strength 5/5 in all 4.  ?Psychiatric: Normal judgment and insight. Alert and oriented x 3. Normal mood.  ? ?Data Reviewed: ?  ?EKG noted sinus rhythm at 89 bpm with short PR interval.  Otherwise reviewed patient's labs, imaging, and pertinent records as noted above in the HPI. ? ?Assessment and Plan: ?Pulmonary embolus  right lower extremity DVT ([place]) ?Acute.  Patient presented with complaints of chest pain as well as complaints of right lower extremity pain.  Prior history of DVT/PE following surgery in 2021 for which patient had been on anticoagulation, but subsequently was discontinued as it was thought to be provoked.  CTA concerning foright lower lobe pulmonary emboli thought to be chronic rather than acute and similar distribution to prior clot seen in 2021 with left lower lobe opacity concerning for pulmonary infarct or acute left lower lobe pneumonia.  Patient has been started on heparin drip along with empiric antibiotics of Rocephin and azithromycin.  Due to patient's symptoms suspect this is possibly acute in nature.  As this is patient's third episode was suspect she needs to be on lifelong anticoagulation. ?-Admit to a medical telemetry  bed ?-Strict bedrest for at least 24 hours since being started on anticoagulation ?-Check Doppler ultrasound of the bilateral lower extremities(positive for right peroneal vein DVT) ?-Check echocardiogram ?-Continue heparin drip per pharmacy ?-Oxycodone as needed for pain ?-Continue empiric antibiotics of Rocephin and azithromycin.  Discontinue if infection thought to be less likely ?-Plan to restart Eliquis as patient reported no issues with taking this medication in the past. ? ?CAP (community acquired pneumonia) ?Patient denies having any significant cough, but did report complaints of wheezing.  CT imaging noted concern for possible left lower lobe infarction versus pneumonia, but did not show convincing evidence for pulmonary embolus.  Patient was started on Rocephin and azithromycin. ?-Follow-up procalcitonin ?-Continue empiric antibiotics of Rocephin and azithromycin  ? ?Leukocytosis ?Acute.  WBC elevated 10.7.  Suspect secondary to above. ? ?Thyroid nodule ?Patient was noted to have 2.7 cm right lobe nodule of the thyroid. ?-Check TSH ?-Recommend thyroid ultrasound in outpatient setting ? ?Rash ?Patient was noted to have this rash of her upper body that started 2 weeks ago.  She had been started on a prednisone taper which she states helped make the rash looks less red in appearance. ?-Recommend outpatient follow-up with dermatology ? 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 ?-May consider outpatient referral to hematology oncology ? ? ? Advance Care Planning:   Code Status: Full Code   ? ?Consults: none ? ?Family Communication: None requested ? ?Severity of Illness: ?The appropriate patient status for this patient is OBSERVATION. Observation status is judged to  be reasonable and necessary in order to provide the required intensity of service to ensure the patient's safety. The patient's presenting symptoms, physical exam findings, and initial radiographic and laboratory data

## 2021-09-14 NOTE — Assessment & Plan Note (Addendum)
Patient with prior history of DVT/PE back in 05/2020 as well as 20 to 25 years ago for which she was on anticoagulation, but both events were thought to be provoked after surgery and therefore anticoagulation was not continued. ?-May consider outpatient referral to hematology oncology ?

## 2021-09-14 NOTE — Plan of Care (Signed)
Patient in bed, alert and oriented x4. No acute distress noted. Patient with noted hive like rash to arms, torso and back. Complains of itching, additional PRN medication given as per orders. Will continue to monitor according to orders and plan of care. ?

## 2021-09-14 NOTE — ED Triage Notes (Signed)
Pt c/o chest pain, wheezing, and RLE swelling.  ?

## 2021-09-14 NOTE — Assessment & Plan Note (Addendum)
Patient presented with complaints of chest pain as well as complaints of right lower extremity pain.  Prior history of DVT/PE following surgery in 2021 for which patient had been on anticoagulation, but subsequently was discontinued as it was thought to be provoked.  CTA concerning foright lower lobe pulmonary emboli thought to be chronic rather than acute and similar distribution to prior clot seen in 2021 with left lower lobe opacity concerning for pulmonary infarct or acute left lower lobe pneumonia.  Patient has been started on heparin drip along with empiric antibiotics of Rocephin and azithromycin.  Due to patient's symptoms suspect this is possibly acute in nature.  As this is patient's third episode was suspect she needs to be on lifelong anticoagulation. ?-Admit to a medical telemetry bed ?-Strict bedrest for at least 24 hours since being started on anticoagulation ?-Check Doppler ultrasound of the bilateral lower extremities ?-Check echocardiogram ?-Continue heparin drip per pharmacy ?-Continue empiric antibiotics of Rocephin and azithromycin.  Discontinue if infection thought to be less likely ?-Plan to restart Eliquis as patient reported no issues with taking this medication in the past. ?

## 2021-09-14 NOTE — ED Provider Triage Note (Signed)
Emergency Medicine Provider Triage Evaluation Note ? ?Marguerette Sheller , a 56 y.o. female  was evaluated in triage.  Pt complains of chest pain and LE swelling.  States worse then trying to lay flat which is what prompted her to come in. Denies fever/chills.  Anticoagulated with eliquis. ? ?Review of Systems  ?Positive: Chest pain, RLE swelling ?Negative: fever ? ?Physical Exam  ?BP 119/64 (BP Location: Left Arm)   Pulse 88   Temp 97.9 ?F (36.6 ?C)   Resp 17   Ht 5\' 5"  (1.651 m)   Wt 104.3 kg   SpO2 97%   BMI 38.27 kg/m?  ?Gen:   Awake, no distress   ?Resp:  Normal effort  ?MSK:   Moves extremities without difficulty  ?Other:   ? ?Medical Decision Making  ?Medically screening exam initiated at 2:46 AM.  Appropriate orders placed.  Aailyah Dunbar was informed that the remainder of the evaluation will be completed by another provider, this initial triage assessment does not replace that evaluation, and the importance of remaining in the ED until their evaluation is complete. ? ?Chest pain, LE edema.  EKG, labs, CXR. ?  ?Idalia Needle, PA-C ?09/14/21 0250 ? ?

## 2021-09-14 NOTE — Assessment & Plan Note (Addendum)
Patient was noted to have 2.7 cm right lobe nodule of the thyroid. ?-Check TSH ?-Recommend thyroid ultrasound in outpatient setting ?

## 2021-09-14 NOTE — Assessment & Plan Note (Addendum)
Patient denies having any significant cough, but did report complaints of wheezing.  CT imaging noted concern for possible left lower lobe infarction versus pneumonia, but did not show convincing evidence for pulmonary embolus.  Patient was started on Rocephin and azithromycin. ?-Follow-up procalcitonin ?-Continue empiric antibiotics of Rocephin and azithromycin  ?

## 2021-09-15 DIAGNOSIS — I2699 Other pulmonary embolism without acute cor pulmonale: Secondary | ICD-10-CM | POA: Diagnosis not present

## 2021-09-15 LAB — CBC
HCT: 33.2 % — ABNORMAL LOW (ref 36.0–46.0)
Hemoglobin: 10.8 g/dL — ABNORMAL LOW (ref 12.0–15.0)
MCH: 27.1 pg (ref 26.0–34.0)
MCHC: 32.5 g/dL (ref 30.0–36.0)
MCV: 83.4 fL (ref 80.0–100.0)
Platelets: 241 10*3/uL (ref 150–400)
RBC: 3.98 MIL/uL (ref 3.87–5.11)
RDW: 14.1 % (ref 11.5–15.5)
WBC: 11.5 10*3/uL — ABNORMAL HIGH (ref 4.0–10.5)
nRBC: 0 % (ref 0.0–0.2)

## 2021-09-15 LAB — BASIC METABOLIC PANEL
Anion gap: 6 (ref 5–15)
BUN: 9 mg/dL (ref 6–20)
CO2: 25 mmol/L (ref 22–32)
Calcium: 8.4 mg/dL — ABNORMAL LOW (ref 8.9–10.3)
Chloride: 106 mmol/L (ref 98–111)
Creatinine, Ser: 0.83 mg/dL (ref 0.44–1.00)
GFR, Estimated: >60 mL/min (ref >60)
Glucose, Bld: 147 mg/dL — ABNORMAL HIGH (ref 70–99)
Potassium: 4 mmol/L (ref 3.5–5.1)
Sodium: 137 mmol/L (ref 135–145)

## 2021-09-15 MED ORDER — SENNOSIDES-DOCUSATE SODIUM 8.6-50 MG PO TABS: 1.0000 | ORAL_TABLET | Freq: Every evening | ORAL | Status: DC | PRN

## 2021-09-15 MED ORDER — APIXABAN (ELIQUIS) VTE STARTER PACK (10MG AND 5MG): ORAL_TABLET | ORAL | 0 refills | Status: DC

## 2021-09-15 MED ORDER — ACETAMINOPHEN 325 MG PO TABS: 650.0000 mg | ORAL_TABLET | Freq: Four times a day (QID) | ORAL | Status: DC | PRN

## 2021-09-15 MED ORDER — AMOXICILLIN-POT CLAVULANATE 875-125 MG PO TABS: 1.0000 | ORAL_TABLET | Freq: Two times a day (BID) | ORAL | 0 refills | Status: AC

## 2021-09-15 MED ORDER — APIXABAN 5 MG PO TABS: 5.0000 mg | ORAL_TABLET | Freq: Two times a day (BID) | ORAL | 0 refills | Status: DC

## 2021-09-15 MED ORDER — OXYCODONE HCL 5 MG PO TABS: 5.0000 mg | ORAL_TABLET | Freq: Four times a day (QID) | ORAL | 0 refills | Status: DC | PRN

## 2021-09-15 MED ORDER — ALUM & MAG HYDROXIDE-SIMETH 200-200-20 MG/5ML PO SUSP: 30.0000 mL | Freq: Four times a day (QID) | ORAL | 0 refills | Status: DC | PRN

## 2021-09-15 MED ORDER — FAMOTIDINE 20 MG PO TABS: 20.0000 mg | ORAL_TABLET | Freq: Two times a day (BID) | ORAL | 0 refills | Status: DC

## 2021-09-15 NOTE — Progress Notes (Signed)
Idalia Needle to be D/C'd home per MD order. Discussed with the patient and all questions fully answered.  ?Skin clean, dry and intact without evidence of skin break down, no evidence of skin tears noted.  ?IV catheters discontinued intact. Site without signs and symptoms of complications. Dressing and pressure applied.  ?An After Visit Summary was printed and given to the patient.  ?Patient escorted via WC, and D/C home via private auto.  ?Jon Gills  ?09/15/2021 11:43 AM ?  ?   ?

## 2021-09-15 NOTE — Progress Notes (Signed)
Patient complained of abdominal pain that she described as "either gas or constipation". She stated that she normally has a bowel movement daily. Contacted on call for PRN medication to assist. Patient medicated according to orders. Patient later requested general pain medication. Meds given, patient fell asleep. Results of GI medication pending. ?

## 2021-09-15 NOTE — Discharge Summary (Signed)
Physician Discharge Summary  ?Nancy Cobb ZOX:[id number] DOB: Jun 21, 1965 DOA: 09/14/2021 ? ?PCP: Nancy Rakers, MD ? ?Admit date: 09/14/2021 ?Discharge date: 09/15/2021 ? ?Admitted From: Home ?Disposition: Home ? ?Recommendations for Outpatient Follow-up:  ?Follow up with PCP in 1-2 weeks ?We will send referral to hematology for follow-up and further recommendations. ? ?Home Health: N/A ?Equipment/Devices: N/A ? ?Discharge Condition: Stable ?CODE STATUS: Full code ?Diet recommendation: Regular diet ? ?Discharge summary: ? ?56 year old with history of DVT PE following surgery 2 years ago that was treated with Eliquis, she does have a history of DVT 20 years ago.  Patient recently suffered from respiratory illness and skin rashes and was treated with prednisone.  Presented back to the emergency room with left-sided pleuritic chest pain, right leg swelling which is acute on chronic and weakness.  In the emergency room hemodynamically stable.  Afebrile.  On room air.  Heart rate is stable.  Chest x-ray with cardiomegaly with low lung volumes.  CTA of the chest with right lower lobe pulmonary emboli likely chronic, left lower lobe opacity likely pulmonary infarct or pneumonia but no evidence of major pulmonary embolus.  Lower extremity duplexes with age-indeterminate chronic right peroneal DVT.  Echocardiogram with normal ejection fraction, no right heart strain.  She was admitted for observation. ? ?Subacute right lower lobe pulmonary embolus/left lower lobe infarction with pleuritic pain/age-indeterminate right peroneal DVT with symptomatic leg edema and swelling, previous 2 thromboembolic events though after lower extremity surgeries. ?-Thromboembolism even though looks like acute on chronic, left lower lobe infarction consistent with clinical evidence of subsegmental pulmonary embolism.  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  Patient is tolerating Eliquis, she has done  it before.  Benefits outweigh risks of anticoagulation, will start patient on anticoagulation and treat as persistent PE and DVT.  Patient is hemodynamically stable.  She may benefit with genetic testing and determine course of anticoagulation, will send an outpatient referral to hematology oncology for follow-up. ?-Pleuritic chest pain clinically related to small pulmonary infarction, however will treat with Augmentin for 7 days in case there is superadded pneumonia.  Patient is afebrile.  No cough or sputum production.  Procalcitonin 0.3.  WC count normal.  She will also continue to do chest physiotherapy at home.  She will take Tylenol.  She will avoid NSAIDs.  Due to significant pleuritic pain, prescribed a short course of oxycodone to use for moderate to severe pain. ? ?Stable.  Ambulated around without tachycardia or shortness of breath.  Able to go home. ? ?Discharge Diagnoses:  ?Active Problems: ?  Pulmonary embolus (HCC) ?  CAP (community acquired pneumonia) ?  Leukocytosis ?  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  Rash ?  History of pulmonary embolism ?  Thyroid nodule ? ? ? ?Discharge Instructions ? ?Discharge Instructions   ? ? Ambulatory referral to Hematology / Oncology   Complete by: As directed ?  ? Call MD for:  severe uncontrolled pain   Complete by: As directed ?  ? Call MD for:  temperature >100.4   Complete by: As directed ?  ? Diet general   Complete by: As directed ?  ? Increase activity slowly   Complete by: As directed ?  ? ?  ? ?Allergies as of 09/15/2021   ?No Known Allergies ?  ? ?  ?Medication List  ?  ? ?STOP taking these medications   ? ?Ozempic (2 MG/DOSE) 8 MG/3ML Sopn ?Generic drug: Semaglutide (2 MG/DOSE) ?  ?  predniSONE 20 MG tablet ?Commonly known as: DELTASONE ?  ? ?  ? ?TAKE these medications   ? ?acetaminophen 325 MG tablet ?Commonly known as: TYLENOL ?Take 2 tablets (650 mg total) by mouth every 6 (six) hours as needed for mild pain, fever or headache (or  Fever >/= 101). ?  ?alum & mag hydroxide-simeth 200-200-20 MG/5ML suspension ?Commonly known as: MAALOX/MYLANTA ?Take 30 mLs by mouth every 6 (six) hours as needed for indigestion or heartburn (FIRST option). ?  ?amoxicillin-clavulanate 875-125 MG tablet ?Commonly known as: Augmentin ?Take 1 tablet by mouth 2 (two) times daily for 7 days. ?  ?Apixaban Starter Pack (10mg  and 5mg ) ?Commonly known as: ELIQUIS STARTER PACK ?Take as directed on package: start with two-5mg  tablets twice daily for 7 days. On day 8, switch to one-5mg  tablet twice daily. ?  ?apixaban 5 MG Tabs tablet ?Commonly known as: ELIQUIS ?Take 1 tablet (5 mg total) by mouth 2 (two) times daily. ?Start taking on: Oct 15, 2021 ?  ?diphenhydrAMINE 25 MG tablet ?Commonly known as: BENADRYL ?Take 1 tablet (25 mg total) by mouth every 6 (six) hours as needed. ?  ?famotidine 20 MG tablet ?Commonly known as: Pepcid ?Take 1 tablet (20 mg total) by mouth 2 (two) times daily. ?What changed: when to take this ?  ?oxyCODONE 5 MG immediate release tablet ?Commonly known as: Oxy IR/ROXICODONE ?Take 1 tablet (5 mg total) by mouth every 6 (six) hours as needed for moderate pain or breakthrough pain. ?  ?senna-docusate 8.6-50 MG tablet ?Commonly known as: Senokot-S ?Take 1 tablet by mouth at bedtime as needed for mild constipation (FIRST option). ?  ? ?  ? ? ?No Known Allergies ? ?Consultations: ?None ? ? ?Procedures/Studies: ?DG Chest 2 View ? ?Result Date: 09/14/2021 ?CLINICAL DATA:  Chest pain, lower extremity edema. EXAM: CHEST - 2 VIEW COMPARISON:  05/25/2020 FINDINGS: . the heart is enlarged the mediastinal contour is within normal limits. Lung volumes are low with strandy atelectasis at the lung bases. No effusion or pneumothorax. Degenerative changes are noted in the thoracic spine. IMPRESSION: 1. Cardiomegaly. 2. Low lung volumes with strandy atelectasis at the lung bases. Electronically Signed   By: 09/16/2021 M.D.   On: 09/14/2021 03:04  ? ?CT Angio Chest  Pulmonary Embolism (PE) W or WO Contrast ? ?Result Date: 09/14/2021 ?CLINICAL DATA:  56 year old female with chest pain and lower extremity swelling. On Eliquis for anticoagulation. Abdominal pain. EXAM: CT ANGIOGRAPHY CHEST CT ABDOMEN AND PELVIS WITH CONTRAST TECHNIQUE: Multidetector CT imaging of the chest was performed using the standard protocol during bolus administration of intravenous contrast. Multiplanar CT image reconstructions and MIPs were obtained to evaluate the vascular anatomy. Multidetector CT imaging of the abdomen and pelvis was performed using the standard protocol during bolus administration of intravenous contrast. RADIATION DOSE REDUCTION: This exam was performed according to the departmental dose-optimization program which includes automated exposure control, adjustment of the mA and/or kV according to patient size and/or use of iterative reconstruction technique. CONTRAST:  50mL OMNIPAQUE IOHEXOL 350 MG/ML SOLN COMPARISON:  CTA chest 05/26/2020. FINDINGS: CTA CHEST FINDINGS Cardiovascular: Good contrast bolus timing in the pulmonary arterial tree. Right lower lobe anterolateral basal segment pulmonary thrombus (series 6, image 176) and similar clot in the nearby subsegmental branches to the posterior basal segment (image 188) appears incompletely occlusive and is in an area which was affected in 2021. There is regional enhancing atelectasis. No central or saddle embolus. There is also no convincing left lower lobe  pulmonary artery filling defect, although abundant left lung base opacity. See details below. Stable cardiac size at the upper limits of normal with no pericardial effusion. Negative visible aorta. No calcified coronary artery atherosclerosis is evident. Mediastinum/Nodes: Chronic thyroid enlargement with indistinct 2.7 cm right lobe hypodense nodule series 5, image 12. No mediastinal mass or lymphadenopathy. Lungs/Pleura: Confluent enhancing right lower lobe atelectasis. Similar  enhancing atelectasis in the medial basal segment of the left lower lobe. But additional confluent nonenhancing opacity at the left lower lobe posterior and lateral basal segments is nonspecific. There w

## 2021-09-15 NOTE — Discharge Instructions (Addendum)
Information on my medicine - ELIQUIS? (apixaban) ? ?This medication education was reviewed with me or my healthcare representative as part of my discharge preparation.  ? ?Why was Eliquis? prescribed for you? ?Eliquis? was prescribed to treat blood clots that may have been found in the veins of your legs (deep vein thrombosis) or in your lungs (pulmonary embolism) and to reduce the risk of them occurring again. ? ?What do You need to know about Eliquis? ? ?The starting dose is 10 mg (two 5 mg tablets) taken TWICE daily for the FIRST SEVEN (7) DAYS, then on 09/21/2021 the dose is reduced to ONE 5 mg tablet taken TWICE daily.  Eliquis? may be taken with or without food.  ? ?Try to take the dose about the same time in the morning and in the evening. If you have difficulty swallowing the tablet whole please discuss with your pharmacist how to take the medication safely. ? ?Take Eliquis? exactly as prescribed and DO NOT stop taking Eliquis? without talking to the doctor who prescribed the medication.  Stopping may increase your risk of developing a new blood clot.  Refill your prescription before you run out. ? ?After discharge, you should have regular check-up appointments with your healthcare provider that is prescribing your Eliquis?. ?   ?What do you do if you miss a dose? ?If a dose of ELIQUIS? is not taken at the scheduled time, take it as soon as possible on the same day and twice-daily administration should be resumed. The dose should not be doubled to make up for a missed dose. ? ?Important Safety Information ?A possible side effect of Eliquis? is bleeding. You should call your healthcare provider right away if you experience any of the following: ?Bleeding from an injury or your nose that does not stop. ?Unusual colored urine (red or dark brown) or unusual colored stools (red or black). ?Unusual bruising for unknown reasons. ?A serious fall or if you hit your head (even if there is no bleeding). ? ?Some  medicines may interact with Eliquis? and might increase your risk of bleeding or clotting while on Eliquis?Marland Kitchen To help avoid this, consult your healthcare provider or pharmacist prior to using any new prescription or non-prescription medications, including herbals, vitamins, non-steroidal anti-inflammatory drugs (NSAIDs) and supplements. ? ?This website has more information on Eliquis? (apixaban): http://www.eliquis.com/eliquis/home  ?

## 2021-09-15 NOTE — TOC Transition Note (Signed)
Transition of Care (TOC) - CM/SW Discharge Note ? ? ?Patient Details  ?Name: Sharetha Newson ?MRN: [id number] ?Date of Birth: 06/09/1966 ? ?Transition of Care (TOC) CM/SW Contact:  ?Carles Collet, RN ?Phone Number: ?09/15/2021, 9:48 AM ? ? ?Clinical Narrative:   Met with patient at bedside and provided with Eliquis PE/ DVT booklet containing 30 day free coupon and copay card as well. Explained use, patient verbalized understanding ? ? ? ?  ?  ? ? ?Patient Goals and CMS Choice ?  ?  ?  ? ?Discharge Placement ?  ?           ?  ?  ?  ?  ? ?Discharge Plan and Services ?  ?  ?           ?  ?  ?  ?  ?  ?  ?  ?  ?  ?  ? ?Social Determinants of Health (SDOH) Interventions ?  ? ? ?Readmission Risk Interventions ?   ? View : No data to display.  ?  ?  ?  ? ? ? ? ? ?

## 2021-09-17 ENCOUNTER — Telehealth: Payer: Self-pay | Admitting: Hematology and Oncology

## 2021-09-17 NOTE — Telephone Encounter (Signed)
Scheduled appointment per 04/03 referral. Patient is aware of appointment date and time. Patient is aware to arrive 15 mins prior to appointment time and to bring updated insurance cards. Patient is aware of location.  ? ?

## 2021-10-02 ENCOUNTER — Telehealth: Payer: Self-pay

## 2021-10-02 NOTE — Telephone Encounter (Signed)
Called and spoke with pt to confirm new Heme apt on 10/03/21 @ 9 am. ?

## 2021-10-03 ENCOUNTER — Other Ambulatory Visit: Payer: Self-pay

## 2021-10-03 ENCOUNTER — Inpatient Hospital Stay: Payer: Managed Care, Other (non HMO)

## 2021-10-03 ENCOUNTER — Inpatient Hospital Stay: Payer: Managed Care, Other (non HMO) | Attending: Hematology and Oncology | Admitting: Hematology and Oncology

## 2021-10-03 VITALS — BP 110/72 | HR 62 | Temp 98.0°F | Resp 16 | Wt 227.1 lb

## 2021-10-03 DIAGNOSIS — I2699 Other pulmonary embolism without acute cor pulmonale: Secondary | ICD-10-CM

## 2021-10-03 DIAGNOSIS — I10 Essential (primary) hypertension: Secondary | ICD-10-CM | POA: Diagnosis not present

## 2021-10-03 DIAGNOSIS — E119 Type 2 diabetes mellitus without complications: Secondary | ICD-10-CM | POA: Diagnosis not present

## 2021-10-03 DIAGNOSIS — Z7901 Long term (current) use of anticoagulants: Secondary | ICD-10-CM | POA: Insufficient documentation

## 2021-10-03 DIAGNOSIS — I82531 Chronic embolism and thrombosis of right popliteal vein: Secondary | ICD-10-CM | POA: Insufficient documentation

## 2021-10-03 LAB — CBC WITH DIFFERENTIAL (CANCER CENTER ONLY)
Abs Immature Granulocytes: 0.04 10*3/uL (ref 0.00–0.07)
Basophils Absolute: 0.1 10*3/uL (ref 0.0–0.1)
Basophils Relative: 2 %
Eosinophils Absolute: 0.1 10*3/uL (ref 0.0–0.5)
Eosinophils Relative: 2 %
HCT: 42.2 % (ref 36.0–46.0)
Hemoglobin: 13.9 g/dL (ref 12.0–15.0)
Immature Granulocytes: 1 %
Lymphocytes Relative: 48 %
Lymphs Abs: 2.2 10*3/uL (ref 0.7–4.0)
MCH: 26.8 pg (ref 26.0–34.0)
MCHC: 32.9 g/dL (ref 30.0–36.0)
MCV: 81.5 fL (ref 80.0–100.0)
Monocytes Absolute: 0.3 10*3/uL (ref 0.1–1.0)
Monocytes Relative: 7 %
Neutro Abs: 1.8 10*3/uL (ref 1.7–7.7)
Neutrophils Relative %: 40 %
Platelet Count: 346 10*3/uL (ref 150–400)
RBC: 5.18 MIL/uL — ABNORMAL HIGH (ref 3.87–5.11)
RDW: 14.5 % (ref 11.5–15.5)
WBC Count: 4.6 10*3/uL (ref 4.0–10.5)
nRBC: 0.4 % — ABNORMAL HIGH (ref 0.0–0.2)

## 2021-10-03 LAB — CMP (CANCER CENTER ONLY)
ALT: 14 U/L (ref 0–44)
AST: 14 U/L — ABNORMAL LOW (ref 15–41)
Albumin: 4 g/dL (ref 3.5–5.0)
Alkaline Phosphatase: 52 U/L (ref 38–126)
Anion gap: 6 (ref 5–15)
BUN: 14 mg/dL (ref 6–20)
CO2: 30 mmol/L (ref 22–32)
Calcium: 10 mg/dL (ref 8.9–10.3)
Chloride: 105 mmol/L (ref 98–111)
Creatinine: 0.97 mg/dL (ref 0.44–1.00)
GFR, Estimated: >60 mL/min (ref >60)
Glucose, Bld: 106 mg/dL — ABNORMAL HIGH (ref 70–99)
Potassium: 3.8 mmol/L (ref 3.5–5.1)
Sodium: 141 mmol/L (ref 135–145)
Total Bilirubin: 0.7 mg/dL (ref 0.3–1.2)
Total Protein: 7.9 g/dL (ref 6.5–8.1)

## 2021-10-03 NOTE — Progress Notes (Signed)
?[facility] ?[phone]   [phone] ? ?INITIAL CONSULT NOTE ? ?Patient Care Team: ?Renaye Rakers, MD as PCP - General (Family Medicine) ? ?Hematological/Oncological History ?# Recurrent DVT/PE ?2001: DVT after surgery on Achilles' Tendon.  Received Coumadin therapy x6 months. ?05/26/2020: [place] LE showed a chronic DVT involving the right popliteal vein and acute DVT of the left gastrocnemius.  Received Eliquis therapy x6 months. ?09/14/2021: LE US showed age indeterminate deep vein thrombosis involving a single right peroneal vein.  Started on Eliquis therapy. ?10/03/2021: establish care with Dr. Leonides Schanz  ? ?CHIEF COMPLAINTS/PURPOSE OF CONSULTATION:  ?"Recurrent DVT/PE " ? ?HISTORY OF PRESENTING ILLNESS:  ?Nancy Cobb 56 y.o. female with medical history significant for DM type II, HTN, and arthritis who presents for evaluation of recurrent VTE.  ? ?On review of the previous records Nancy Cobb has a long history of DVTs.  Previously in 2001 she underwent surgery of Achilles tendon and developed a DVT at that time.  She was placed on Coumadin therapy for limited duration.  On 05/26/2020 the patient underwent ultrasound which showed a lower extremity chronic DVT involving the right popliteal vein and acute DVT of the left gastrocnemius.  She was started on Eliquis therapy for 6 months time.  Most recently on 09/14/2021 the patient underwent a lower extremity ultrasound showing age-indeterminate deep vein thrombosis involving a single right peroneal vein.  Due to concern for this finding the patient was referred to hematology for further evaluation and management. ? ?On exam today Nancy Cobb reports that her first 2 DVTs were provoked by surgeries.  She notes the most recent one had no clear provoking factor.  She notes that she has had no prolonged travel, no injury, or recent surgery.  She still had no vaccines or recent illnesses.  She reports that she is currently taking Eliquis  therapy and tolerating it well with no bleeding, bruising, or dark stools.  She also notes that she is having considerably less swelling and pain since she started this therapy.  She is not having any other shortness of breath or chest pain ? ?On further discussion she notes that her mother had a history of hepatitis and her father died of a stroke.  She notes he has a twin who has multiple sclerosis.  She notes that she has 2 healthy children and 3 granddaughters.  She is a never smoker and only drinks on occasion.  She reports that she works for the [employer].  She notes that she has been doing all right other than some modest fatigue.  She currently denies any fevers, chills, sweats, nausea, vomiting or diarrhea.  Full 10 point ROS is listed below. ? ?Of note she is planning a trip to [place] for [place], [place], and [place] with her friends in the summer 2024. ? ?MEDICAL HISTORY:  ?Past Medical History:  ?Diagnosis Date  ? Arthritis   ? Diabetes mellitus without complication (HCC)   ? Hypertension   ? ? ?SURGICAL HISTORY: ?Past Surgical History:  ?Procedure Laterality Date  ? ABDOMINAL HYSTERECTOMY    ? APPENDECTOMY    ? ? ?SOCIAL HISTORY: ?Social History  ? ?Socioeconomic History  ? Marital status: Single  ?  Spouse name: Not on file  ? Number of children: Not on file  ? Years of education: Not on file  ? Highest education level: Not on file  ?Occupational History  ? Not on file  ?Tobacco Use  ? Smoking status: Never  ? Smokeless  tobacco: Never  ?Substance and Sexual Activity  ? Alcohol use: No  ? Drug use: No  ? Sexual activity: Not on file  ?Other Topics Concern  ? Not on file  ?Social History Narrative  ? Not on file  ? ?Social Determinants of Health  ? ?Financial Resource Strain: Not on file  ?Food Insecurity: Not on file  ?Transportation Needs: Not on file  ?Physical Activity: Not on file  ?Stress: Not on file  ?Social Connections: Not on file  ?Intimate Partner Violence: Not on file  ? ? ?FAMILY HISTORY: ?No  family history on file. ? ?ALLERGIES:  has No Known Allergies. ? ?MEDICATIONS:  ?Current Outpatient Medications  ?Medication Sig Dispense Refill  ? acetaminophen (TYLENOL) 325 MG tablet Take 2 tablets (650 mg total) by mouth every 6 (six) hours as needed for mild pain, fever or headache (or Fever >/= 101).    ? alum & mag hydroxide-simeth (MAALOX/MYLANTA) 200-200-20 MG/5ML suspension Take 30 mLs by mouth every 6 (six) hours as needed for indigestion or heartburn (FIRST option). 355 mL 0  ? [START ON 10/15/2021] apixaban (ELIQUIS) 5 MG TABS tablet Take 1 tablet (5 mg total) by mouth 2 (two) times daily. 60 tablet 0  ? APIXABAN (ELIQUIS) VTE STARTER PACK (10MG  AND 5MG ) Take as directed on package: start with two-5mg  tablets twice daily for 7 days. On day 8, switch to one-5mg  tablet twice daily. 1 each 0  ? diphenhydrAMINE (BENADRYL) 25 MG tablet Take 1 tablet (25 mg total) by mouth every 6 (six) hours as needed. 30 tablet 0  ? famotidine (PEPCID) 20 MG tablet Take 1 tablet (20 mg total) by mouth 2 (two) times daily. 60 tablet 0  ? oxyCODONE (OXY IR/ROXICODONE) 5 MG immediate release tablet Take 1 tablet (5 mg total) by mouth every 6 (six) hours as needed for moderate pain or breakthrough pain. 15 tablet 0  ? senna-docusate (SENOKOT-S) 8.6-50 MG tablet Take 1 tablet by mouth at bedtime as needed for mild constipation (FIRST option).    ? ?No current facility-administered medications for this visit.  ? ? ?REVIEW OF SYSTEMS:   ?Constitutional: ( - ) fevers, ( - )  chills , ( - ) night sweats ?Eyes: ( - ) blurriness of vision, ( - ) double vision, ( - ) watery eyes ?Ears, nose, mouth, throat, and face: ( - ) mucositis, ( - ) sore throat ?Respiratory: ( - ) cough, ( - ) dyspnea, ( - ) wheezes ?Cardiovascular: ( - ) palpitation, ( - ) chest discomfort, ( - ) lower extremity swelling ?Gastrointestinal:  ( - ) nausea, ( - ) heartburn, ( - ) change in bowel habits ?Skin: ( - ) abnormal skin rashes ?Lymphatics: ( - ) new  lymphadenopathy, ( - ) easy bruising ?Neurological: ( - ) numbness, ( - ) tingling, ( - ) new weaknesses ?Behavioral/Psych: ( - ) mood change, ( - ) new changes  ?All other systems were reviewed with the patient and are negative. ? ?PHYSICAL EXAMINATION: ? ?Vitals:  ? 10/03/21 0925  ?BP: 110/72  ?Pulse: 62  ?Resp: 16  ?Temp: 98 ?F (36.7 ?C)  ?SpO2: 100%  ? ?Filed Weights  ? 10/03/21 0925  ?Weight: 227 lb 1.6 oz (103 kg)  ? ? ?GENERAL: well appearing middle-aged African-American female in NAD  ?SKIN: skin color, texture, turgor are normal, no rashes or significant lesions ?EYES: conjunctiva are pink and non-injected, sclera clear ?LUNGS: clear to auscultation and percussion with normal breathing effort ?  HEART: regular rate & rhythm and no murmurs and no lower extremity edema ?Musculoskeletal: no cyanosis of digits and no clubbing  ?PSYCH: alert & oriented x 3, fluent speech ?NEURO: no focal motor/sensory deficits ? ?LABORATORY DATA:  ?I have reviewed the data as listed ? ?  Latest Ref Rng & Units 09/15/2021  ?  1:26 AM 09/14/2021  ?  3:07 AM 05/26/2020  ?  3:28 AM  ?CBC  ?WBC 4.0 - 10.5 K/uL 11.5   10.7   10.4    ?Hemoglobin 12.0 - 15.0 g/dL 96.010.8   45.412.2   09.812.5    ?Hematocrit 36.0 - 46.0 % 33.2   36.9   37.5    ?Platelets 150 - 400 K/uL 241   272   248    ? ? ? ?  Latest Ref Rng & Units 09/15/2021  ?  1:26 AM 09/14/2021  ?  4:46 AM 09/14/2021  ?  3:07 AM  ?CMP  ?Glucose 70 - 99 mg/dL 119147    147135    ?BUN 6 - 20 mg/dL 9    13    ?Creatinine 0.44 - 1.00 mg/dL 8.290.83    5.621.03    ?Sodium 135 - 145 mmol/L 137    135    ?Potassium 3.5 - 5.1 mmol/L 4.0    4.1    ?Chloride 98 - 111 mmol/L 106    102    ?CO2 22 - 32 mmol/L 25    26    ?Calcium 8.9 - 10.3 mg/dL 8.4    8.9    ?Total Protein 6.5 - 8.1 g/dL  7.2     ?Total Bilirubin 0.3 - 1.2 mg/dL  0.4     ?Alkaline Phos 38 - 126 U/L  58     ?AST 15 - 41 U/L  19     ?ALT 0 - 44 U/L  24     ? ? ? ?ASSESSMENT & PLAN ?Nancy Cobb 56 y.o. female with medical history significant for DM type II,  HTN, and arthritis who presents for evaluation of recurrent VTE.  ? ?After review of the labs, review of the records, and discussion with the patient the patients findings are most consistent with an unprovok

## 2021-10-04 ENCOUNTER — Telehealth: Payer: Self-pay | Admitting: Hematology and Oncology

## 2021-10-04 NOTE — Telephone Encounter (Signed)
Scheduled per 4/19 los, pt has been called and confirmed  ?

## 2021-10-05 LAB — BETA-2-GLYCOPROTEIN I ABS, IGG/M/A
Beta-2 Glyco I IgG: <9 GPI IgG units (ref 0–20)
Beta-2-Glycoprotein I IgA: <9 GPI IgA units (ref 0–25)
Beta-2-Glycoprotein I IgM: <9 GPI IgM units (ref 0–32)

## 2021-10-05 LAB — CARDIOLIPIN ANTIBODIES, IGG, IGM, IGA
Anticardiolipin IgA: <9 APL U/mL (ref 0–11)
Anticardiolipin IgG: 41 GPL U/mL — ABNORMAL HIGH (ref 0–14)
Anticardiolipin IgM: 65 MPL U/mL — ABNORMAL HIGH (ref 0–12)

## 2021-12-13 ENCOUNTER — Ambulatory Visit: Payer: Managed Care, Other (non HMO) | Admitting: Student

## 2022-01-18 ENCOUNTER — Other Ambulatory Visit: Payer: Self-pay | Admitting: *Deleted

## 2022-01-18 MED ORDER — APIXABAN 5 MG PO TABS: 5.0000 mg | ORAL_TABLET | Freq: Two times a day (BID) | ORAL | 0 refills | Status: DC

## 2022-04-02 ENCOUNTER — Ambulatory Visit: Payer: Managed Care, Other (non HMO) | Admitting: Hematology and Oncology

## 2022-04-02 ENCOUNTER — Other Ambulatory Visit: Payer: Managed Care, Other (non HMO)

## 2022-04-12 ENCOUNTER — Inpatient Hospital Stay: Payer: Managed Care, Other (non HMO)

## 2022-04-12 ENCOUNTER — Other Ambulatory Visit: Payer: Self-pay | Admitting: Physician Assistant

## 2022-04-12 ENCOUNTER — Inpatient Hospital Stay: Payer: Managed Care, Other (non HMO) | Attending: Physician Assistant

## 2022-04-12 ENCOUNTER — Other Ambulatory Visit: Payer: Self-pay

## 2022-04-12 ENCOUNTER — Inpatient Hospital Stay (HOSPITAL_BASED_OUTPATIENT_CLINIC_OR_DEPARTMENT_OTHER): Payer: Managed Care, Other (non HMO) | Admitting: Physician Assistant

## 2022-04-12 VITALS — BP 116/71 | HR 58 | Temp 97.2°F | Resp 17 | Wt 233.4 lb

## 2022-04-12 DIAGNOSIS — Z86711 Personal history of pulmonary embolism: Secondary | ICD-10-CM | POA: Insufficient documentation

## 2022-04-12 DIAGNOSIS — Z86718 Personal history of other venous thrombosis and embolism: Secondary | ICD-10-CM | POA: Diagnosis not present

## 2022-04-12 DIAGNOSIS — I2699 Other pulmonary embolism without acute cor pulmonale: Secondary | ICD-10-CM

## 2022-04-12 DIAGNOSIS — Z7901 Long term (current) use of anticoagulants: Secondary | ICD-10-CM | POA: Insufficient documentation

## 2022-04-12 LAB — CBC WITH DIFFERENTIAL (CANCER CENTER ONLY)
Abs Immature Granulocytes: 0.04 10*3/uL (ref 0.00–0.07)
Basophils Absolute: 0.1 10*3/uL (ref 0.0–0.1)
Basophils Relative: 1 %
Eosinophils Absolute: 0.2 10*3/uL (ref 0.0–0.5)
Eosinophils Relative: 2 %
HCT: 39.2 % (ref 36.0–46.0)
Hemoglobin: 13 g/dL (ref 12.0–15.0)
Immature Granulocytes: 1 %
Lymphocytes Relative: 46 %
Lymphs Abs: 3.6 10*3/uL (ref 0.7–4.0)
MCH: 28.4 pg (ref 26.0–34.0)
MCHC: 33.2 g/dL (ref 30.0–36.0)
MCV: 85.6 fL (ref 80.0–100.0)
Monocytes Absolute: 0.5 10*3/uL (ref 0.1–1.0)
Monocytes Relative: 7 %
Neutro Abs: 3.3 10*3/uL (ref 1.7–7.7)
Neutrophils Relative %: 43 %
Platelet Count: 268 10*3/uL (ref 150–400)
RBC: 4.58 MIL/uL (ref 3.87–5.11)
RDW: 13.4 % (ref 11.5–15.5)
WBC Count: 7.7 10*3/uL (ref 4.0–10.5)
nRBC: 0 % (ref 0.0–0.2)

## 2022-04-12 LAB — CMP (CANCER CENTER ONLY)
ALT: 17 U/L (ref 0–44)
AST: 16 U/L (ref 15–41)
Albumin: 3.9 g/dL (ref 3.5–5.0)
Alkaline Phosphatase: 58 U/L (ref 38–126)
Anion gap: 4 — ABNORMAL LOW (ref 5–15)
BUN: 15 mg/dL (ref 6–20)
CO2: 29 mmol/L (ref 22–32)
Calcium: 9.5 mg/dL (ref 8.9–10.3)
Chloride: 107 mmol/L (ref 98–111)
Creatinine: 0.92 mg/dL (ref 0.44–1.00)
GFR, Estimated: >60 mL/min (ref >60)
Glucose, Bld: 82 mg/dL (ref 70–99)
Potassium: 3.7 mmol/L (ref 3.5–5.1)
Sodium: 140 mmol/L (ref 135–145)
Total Bilirubin: 0.4 mg/dL (ref 0.3–1.2)
Total Protein: 7.2 g/dL (ref 6.5–8.1)

## 2022-04-14 LAB — CARDIOLIPIN ANTIBODIES, IGG, IGM, IGA
Anticardiolipin IgA: <9 APL U/mL (ref 0–11)
Anticardiolipin IgG: <9 GPL U/mL (ref 0–14)
Anticardiolipin IgM: <9 MPL U/mL (ref 0–12)

## 2022-04-14 MED ORDER — APIXABAN 2.5 MG PO TABS: 2.5000 mg | ORAL_TABLET | Freq: Two times a day (BID) | ORAL | 6 refills | Status: DC

## 2022-04-14 NOTE — Progress Notes (Signed)
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  Received Coumadin therapy x6 months. 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  CTA showed nonocclusive thrombus in the posterior left lower lobe subsegmental branches as well as right middle lobe and posterior right lower lobe subsegmental arterial branches. Felt to be provoked after undergoing total knee replacement on 04/28/2020.  Received Eliquis therapy x6 months. 09/14/2021: LE US showed age indeterminate deep vein thrombosis involving a single right peroneal vein.  CTA showed right lower lobe segmental and subsegmental pulmonary emboli. Started on Eliquis therapy. 10/03/2021: establish care with Dr. Lorenso Cobb   CHIEF COMPLAINTS/PURPOSE OF CONSULTATION:  "Recurrent DVT/PE "  HISTORY OF PRESENTING ILLNESS:  Nancy Cobb 56 y.o. 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 She is unaccompanied for this visit.   Ms. Nancy Cobb reports she is tolerating  Eliquis therapy without any prohibitive toxicities. She reports one episode of right leg edema that resolved on its own. She denies any easy bruising or signs of bleeding including hematochezia or melena. She reports she is doing well otherwise. Her energy and appetite are stable. She is able to complete all her daily activities on her own. She denies any fever, chills, sweats, shortness of breath, chest pain, diarrhea, nausea or vomiting. She has no other complaints. Rest of the 10 point ROS is below.   MEDICAL HISTORY:  Past Medical History:  Diagnosis Date   Arthritis    Diabetes mellitus without complication ([place])    Hypertension     SURGICAL HISTORY: Past Surgical History:   Procedure Laterality Date   ABDOMINAL HYSTERECTOMY     APPENDECTOMY      SOCIAL HISTORY: Social History   Socioeconomic History   Marital status: Single    Spouse name: Not on file   Number of children: Not on file   Years of education: Not on file   Highest education level: Not on file  Occupational History   Not on file  Tobacco Use   Smoking status: Never   Smokeless tobacco: Never  Substance and Sexual Activity   Alcohol use: No   Drug use: No   Sexual activity: Not on file  Other Topics Concern   Not on file  Social History Narrative   Not on file   Social Determinants of Health   Financial Resource Strain: Not on file  Food Insecurity: Not on file  Transportation Needs: Not on file  Physical Activity: Not on file  Stress: Not on file  Social Connections: Not on file  Intimate Partner Violence: Not on file    FAMILY HISTORY: No family history on file.  ALLERGIES:  has No Known Allergies.  MEDICATIONS:  Current Outpatient Medications  Medication Sig Dispense Refill   apixaban (ELIQUIS) 5 MG TABS tablet Take 1 tablet (5 mg total) by mouth 2 (two) times daily. 60 tablet 0   irbesartan-hydrochlorothiazide (AVALIDE) 150-12.5 MG tablet Take 1 tablet by mouth daily.     No current facility-administered medications for this visit.    REVIEW OF SYSTEMS:   Constitutional: ( - ) fevers, ( - )  chills , ( - ) night sweats Eyes: ( - ) blurriness of vision, ( - ) double vision, ( - )  watery eyes Ears, nose, mouth, throat, and face: ( - ) mucositis, ( - ) sore throat Respiratory: ( - ) cough, ( - ) dyspnea, ( - ) wheezes Cardiovascular: ( - ) palpitation, ( - ) chest discomfort, ( - ) lower extremity swelling Gastrointestinal:  ( - ) nausea, ( - ) heartburn, ( - ) change in bowel habits Skin: ( - ) abnormal skin rashes Lymphatics: ( - ) new lymphadenopathy, ( - ) easy bruising Neurological: ( - ) numbness, ( - ) tingling, ( - ) new weaknesses Behavioral/Psych: (  - ) mood change, ( - ) new changes  All other systems were reviewed with the patient and are negative.  PHYSICAL EXAMINATION:  Vitals:   04/12/22 1343  BP: 116/71  Pulse: (!) 58  Resp: 17  Temp: (!) 97.2 F (36.2 C)   Filed Weights   04/12/22 1343  Weight: 233 lb 6 oz (105.9 kg)    GENERAL: well appearing middle-aged African-American female in NAD  SKIN: skin color, texture, turgor are normal, no rashes or significant lesions EYES: conjunctiva are pink and non-injected, sclera clear LUNGS: clear to auscultation and percussion with normal breathing effort HEART: regular rate & rhythm and no murmurs and no lower extremity edema Musculoskeletal: no cyanosis of digits and no clubbing  PSYCH: alert & oriented x 3, fluent speech NEURO: no focal motor/sensory deficits  LABORATORY DATA:  I have reviewed the data as listed    Latest Ref Rng & Units 04/12/2022    1:17 PM 10/03/2021   10:12 AM 09/15/2021    1:26 AM  CBC  WBC 4.0 - 10.5 K/uL 7.7  4.6  11.5   Hemoglobin 12.0 - 15.0 g/dL 33.5  45.6  25.6   Hematocrit 36.0 - 46.0 % 39.2  42.2  33.2   Platelets 150 - 400 K/uL 268  346  241        Latest Ref Rng & Units 04/12/2022    1:17 PM 10/03/2021   10:12 AM 09/15/2021    1:26 AM  CMP  Glucose 70 - 99 mg/dL 82  389  373   BUN 6 - 20 mg/dL 15  14  9    Creatinine 0.44 - 1.00 mg/dL  4.28  7.68   Sodium 135 - 145 mmol/L 140  141  137   Potassium 3.5 - 5.1 mmol/L 3.7  3.8  4.0   Chloride 98 - 111 mmol/L 107  105  106   CO2 22 - 32 mmol/L 29  30  25    Calcium 8.9 - 10.3 mg/dL 9.5  1.15  8.4   Total Protein 6.5 - 8.1 g/dL 7.2  7.9    Total Bilirubin 0.3 - 1.2 mg/dL 0.4  0.7    Alkaline Phos 38 - 126 U/L 58  52    AST 15 - 41 U/L 16  14    ALT 0 - 44 U/L 17  14       ASSESSMENT & PLAN Nancy Cobb is a 56 y.o. female who returns for a follow up for recurrent DVTs.   #Unprovoked DVT/PE # Recurrent Provoked VTEs   --First episode was in 2001 after undergo Achilles' Tendon  repair. Received coumadin x 6 months. --Second episode was in 2021. Felt to be provoked after undergoing TKA. Received Eliquis x 6 months.  --Third and most recent episode was in March 2023. Felt to be unprovoked. Started on Eliquis therapy. --Cardiolipin antibodies were borderline elevated on 10/03/2021, IgG  41 U/ml, IgM 65 U/mL.Repeat cardiolipin levels today were not elevated. Beta-2 glycoprotein antibodies were not elevated when checked on 10/03/2021.  --Tolerating Eliquis 5 mg twice daily. Patient is eligible for maintenance dose so will transition to 2.5 mg twice daily.  --patient denies any bleeding, bruising, or dark stools on this medication. It is well tolerated. No difficulties accessing/affording the medication  --RTC in 6 months' time with strict return precautions for overt signs of bleeding.   Orders Placed This Encounter  Procedures   Cardiolipin antibodies, IgG, IgM, IgA*    Standing Status:   Future    Number of Occurrences:   1    Standing Expiration Date:   04/12/2023    All questions were answered. The patient knows to call the clinic with any problems, questions or concerns.  I have spent a total of 30 minutes minutes of face-to-face and non-face-to-face time, preparing to see the patient,  performing a medically appropriate examination, counseling and educating the patient, ordering medications/tests, documenting clinical information in the electronic health record,  and care coordination.   Georga Kaufmann PA-C Dept of Hematology and Oncology [facility] Phone: [phone]   04/14/2022 9:42 PM

## 2022-04-15 ENCOUNTER — Telehealth: Payer: Self-pay

## 2022-04-15 ENCOUNTER — Telehealth: Payer: Self-pay | Admitting: Hematology and Oncology

## 2022-04-15 NOTE — Telephone Encounter (Signed)
Pt advised of recent lab orders negative for clotting disorder and to continue Rx for maintenance dose of Eliquis.  Pt has scheduled her 6 mo f/up appt for 09/2022

## 2022-04-15 NOTE — Telephone Encounter (Signed)
Per 10/27 los called and spoke to pt about appointment  

## 2022-06-25 ENCOUNTER — Other Ambulatory Visit (HOSPITAL_COMMUNITY): Payer: Self-pay

## 2022-06-25 MED ORDER — OFLOXACIN 0.3 % OP SOLN
2.0000 [drp] | Freq: Two times a day (BID) | OPHTHALMIC | 0 refills | Status: DC
  Filled 2022-06-25: qty 10, 25d supply, fill #0

## 2022-06-25 MED ORDER — OZEMPIC (2 MG/DOSE) 8 MG/3ML ~~LOC~~ SOPN
2.0000 mg | PEN_INJECTOR | SUBCUTANEOUS | 3 refills | Status: AC
  Filled 2022-06-25 – 2022-06-28 (×5): qty 3, 28d supply, fill #0

## 2022-06-26 ENCOUNTER — Other Ambulatory Visit (HOSPITAL_COMMUNITY): Payer: Self-pay

## 2022-06-28 ENCOUNTER — Other Ambulatory Visit (HOSPITAL_COMMUNITY): Payer: Self-pay

## 2022-07-03 ENCOUNTER — Other Ambulatory Visit: Payer: Self-pay

## 2022-07-03 ENCOUNTER — Telehealth: Payer: Self-pay

## 2022-07-03 MED ORDER — APIXABAN 2.5 MG PO TABS: 2.5000 mg | ORAL_TABLET | Freq: Two times a day (BID) | ORAL | 6 refills | Status: DC

## 2022-07-03 NOTE — Telephone Encounter (Signed)
Patient has new insurance which doesn't pat for Eliquis  her new insurance covers warfarin. Patient has 4 days left of Eliquis.

## 2022-07-03 NOTE — Telephone Encounter (Signed)
Can we see if she is eligible for patient assistance program?

## 2022-07-04 ENCOUNTER — Telehealth: Payer: Self-pay

## 2022-07-04 ENCOUNTER — Other Ambulatory Visit: Payer: Self-pay | Admitting: Physician Assistant

## 2022-07-04 MED ORDER — ENOXAPARIN SODIUM 100 MG/ML IJ SOSY: 100.0000 mg | PREFILLED_SYRINGE | Freq: Two times a day (BID) | INTRAMUSCULAR | 0 refills | Status: DC

## 2022-07-04 NOTE — Telephone Encounter (Signed)
Called patient to advise that she has appointments for chest x-ray @ 1 PM, lab draw, flush apt and then [facility] visit to follow.

## 2022-07-04 NOTE — Telephone Encounter (Signed)
Called patient to inform her that once her Eliquis runs out, a prescription for Lovenox has been sent to her pharmacy. Instructed that Lovenox website has excellent educational information on how to self inject medication.

## 2022-07-04 NOTE — Progress Notes (Signed)
Patient reports that Eliquis is no longer covered by insurance. We have applied for PAP. Patient has 6 days of Eliquis left so sent 7 day Lovenox bridge if PAP is not approved and if we need to transition to coumadin.

## 2022-07-04 NOTE — Telephone Encounter (Signed)
Patient called stating that she cannot afford her Lovenox prescription. I encouraged her to try [facility]. Patient will come to [facility] to bring necessary documents for Eliquis PAP.

## 2022-07-05 ENCOUNTER — Telehealth: Payer: Self-pay | Admitting: *Deleted

## 2022-07-05 NOTE — Telephone Encounter (Signed)
Nancy Cobb came in to complete the [name] Patient Assistance form for Eliquis. States she did NOT pick up Lovenox prescription due to $190 cost. States she has been taking the eliquis. Is asking about cheaper anticoagulant options.  Completed BM form faxed.

## 2022-07-08 ENCOUNTER — Other Ambulatory Visit: Payer: Self-pay

## 2022-07-08 ENCOUNTER — Other Ambulatory Visit (HOSPITAL_COMMUNITY): Payer: Self-pay

## 2022-07-08 ENCOUNTER — Telehealth: Payer: Self-pay

## 2022-07-08 MED ORDER — APIXABAN 2.5 MG PO TABS
2.5000 mg | ORAL_TABLET | Freq: Two times a day (BID) | ORAL | 6 refills | Status: DC
  Filled 2022-07-08: qty 60, 30d supply, fill #0

## 2022-07-08 NOTE — Telephone Encounter (Addendum)
Spoke with patient. She is unable to afford Lovenox. I called [employer] and they  have a coupon for Eliquis that may allow pt to get this prescription for $10 monthly. Prescription sent to [facility]. Patient agreed to call [facility] pharmacy and she will call me back if she has problems obtaining medication.  Patient called back. She does not qualify for discount card from [facility].  This Oncologist. They should have a decision on PAP application by 0/07/62. Spoke with patient and she will reach out to [place] @ [phone] on 07/10/22 and will call clinic with decision.

## 2022-07-10 ENCOUNTER — Telehealth: Payer: Self-pay

## 2022-07-10 NOTE — Telephone Encounter (Addendum)
Spoke with patient to advise she will need to pick up Lovenox prescription to take while she waits for her Eliquis to be mailed from [employer] which can take 7-10 business days. Explained risk of blood clots if she does not take Lovenox. Patient states that she will "find the money" to pick up  prescription.  [facility]ed [facility] [address]. Lovenox is in stock.

## 2022-07-10 NOTE — Telephone Encounter (Signed)
Called [employer]. Patient approved for Eliquis on 07/10/22. Patient should receive Eliquis in 7-10 business days.

## 2022-07-12 ENCOUNTER — Telehealth: Payer: Self-pay

## 2022-07-12 NOTE — Telephone Encounter (Signed)
Received FAX from [place]. Form that was submitted on 07/05/22 was the old version of the form. Fax received today is requesting a completed , signed and dated and/ or patient portion of the application. I called them and they can use the information provided by patient but will need provider for for completed and faxed. This will need to be signed by Murray Hodgkins PA. Form given to [facility] LPN to be completed on 07/15/22.

## 2022-07-15 NOTE — Telephone Encounter (Signed)
Form faxed and confirmation received.

## 2022-07-16 ENCOUNTER — Other Ambulatory Visit (HOSPITAL_COMMUNITY): Payer: Self-pay

## 2022-07-18 ENCOUNTER — Telehealth: Payer: Self-pay | Admitting: Hematology and Oncology

## 2022-07-18 ENCOUNTER — Other Ambulatory Visit: Payer: Self-pay | Admitting: Physician Assistant

## 2022-07-18 ENCOUNTER — Telehealth: Payer: Self-pay

## 2022-07-18 ENCOUNTER — Other Ambulatory Visit: Payer: Self-pay

## 2022-07-18 ENCOUNTER — Other Ambulatory Visit (HOSPITAL_COMMUNITY): Payer: Self-pay

## 2022-07-18 MED ORDER — APIXABAN 2.5 MG PO TABS: 2.5000 mg | ORAL_TABLET | Freq: Two times a day (BID) | ORAL | 6 refills | Status: DC

## 2022-07-18 MED ORDER — WARFARIN SODIUM 5 MG PO TABS: 5.0000 mg | ORAL_TABLET | Freq: Every day | ORAL | 0 refills | Status: DC

## 2022-07-18 NOTE — Telephone Encounter (Signed)
Spoke with patient about patient assistance program for Eliquis. Re-faxed previous chart information and prescription with current BMS form to [place] and spoke with representative. They had not received fax from 07/15/22 even though we have a fax confirmation.   This Probation officer had [facility] to ask that previous prescription for lovonox that has been cancelled be refilled. During today's conversation patient said that she had picked up Lovenox and had "taken it". Upon further discussion she admitted that she had not picked up the prescription. Patient again counseled on risk of blood clots with non-compliance.  Patient agreed to come in to speak with Dr. Lorenso Courier. Scheduling message sent.

## 2022-07-18 NOTE — Telephone Encounter (Signed)
Called patient per 2/1 secure chat to confirm new appointments. Patient notified.

## 2022-07-23 ENCOUNTER — Inpatient Hospital Stay: Payer: 59

## 2022-07-23 ENCOUNTER — Other Ambulatory Visit: Payer: Self-pay

## 2022-07-23 ENCOUNTER — Inpatient Hospital Stay: Payer: 59 | Attending: Hematology and Oncology

## 2022-07-23 ENCOUNTER — Inpatient Hospital Stay (HOSPITAL_BASED_OUTPATIENT_CLINIC_OR_DEPARTMENT_OTHER): Payer: Managed Care, Other (non HMO) | Admitting: Hematology and Oncology

## 2022-07-23 ENCOUNTER — Other Ambulatory Visit: Payer: Self-pay | Admitting: Hematology and Oncology

## 2022-07-23 VITALS — BP 133/72 | HR 73 | Temp 97.6°F | Resp 13 | Wt 233.8 lb

## 2022-07-23 DIAGNOSIS — Z86718 Personal history of other venous thrombosis and embolism: Secondary | ICD-10-CM | POA: Diagnosis present

## 2022-07-23 DIAGNOSIS — Z7901 Long term (current) use of anticoagulants: Secondary | ICD-10-CM | POA: Insufficient documentation

## 2022-07-23 DIAGNOSIS — I2699 Other pulmonary embolism without acute cor pulmonale: Secondary | ICD-10-CM

## 2022-07-23 DIAGNOSIS — Z86711 Personal history of pulmonary embolism: Secondary | ICD-10-CM | POA: Insufficient documentation

## 2022-07-23 LAB — CMP (CANCER CENTER ONLY)
ALT: 11 U/L (ref 0–44)
AST: 12 U/L — ABNORMAL LOW (ref 15–41)
Albumin: 4.1 g/dL (ref 3.5–5.0)
Alkaline Phosphatase: 48 U/L (ref 38–126)
Anion gap: 5 (ref 5–15)
BUN: 14 mg/dL (ref 6–20)
CO2: 28 mmol/L (ref 22–32)
Calcium: 10 mg/dL (ref 8.9–10.3)
Chloride: 107 mmol/L (ref 98–111)
Creatinine: 0.83 mg/dL (ref 0.44–1.00)
GFR, Estimated: >60 mL/min (ref >60)
Glucose, Bld: 70 mg/dL (ref 70–99)
Potassium: 4.2 mmol/L (ref 3.5–5.1)
Sodium: 140 mmol/L (ref 135–145)
Total Bilirubin: 0.4 mg/dL (ref 0.3–1.2)
Total Protein: 7.2 g/dL (ref 6.5–8.1)

## 2022-07-23 LAB — CBC WITH DIFFERENTIAL (CANCER CENTER ONLY)
Abs Immature Granulocytes: 0.01 10*3/uL (ref 0.00–0.07)
Basophils Absolute: 0.1 10*3/uL (ref 0.0–0.1)
Basophils Relative: 1 %
Eosinophils Absolute: 0.1 10*3/uL (ref 0.0–0.5)
Eosinophils Relative: 1 %
HCT: 40.8 % (ref 36.0–46.0)
Hemoglobin: 13.8 g/dL (ref 12.0–15.0)
Immature Granulocytes: 0 %
Lymphocytes Relative: 51 %
Lymphs Abs: 3.2 10*3/uL (ref 0.7–4.0)
MCH: 28.2 pg (ref 26.0–34.0)
MCHC: 33.8 g/dL (ref 30.0–36.0)
MCV: 83.3 fL (ref 80.0–100.0)
Monocytes Absolute: 0.4 10*3/uL (ref 0.1–1.0)
Monocytes Relative: 6 %
Neutro Abs: 2.6 10*3/uL (ref 1.7–7.7)
Neutrophils Relative %: 41 %
Platelet Count: 253 10*3/uL (ref 150–400)
RBC: 4.9 MIL/uL (ref 3.87–5.11)
RDW: 13.7 % (ref 11.5–15.5)
WBC Count: 6.4 10*3/uL (ref 4.0–10.5)
nRBC: 0 % (ref 0.0–0.2)

## 2022-07-23 LAB — PROTIME-INR
INR: 1 (ref 0.8–1.2)
Prothrombin Time: 13.1 seconds (ref 11.4–15.2)

## 2022-07-23 NOTE — Progress Notes (Signed)
Patient reports starting Warfarin 5 mg once daily on 07/20/2022.  She denies having been notified of need for having her INR checked.    Called Dr Sheilah Mins office to advise that patient needed to be schedule for coumadin clinic.  Nursing staff states that patient has been seeing Terance Hart, PA.   Staff states that will call her directly and follow up with getting her scheduled.  Patient was made aware.  We will check INR today.

## 2022-07-23 NOTE — Progress Notes (Signed)
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  Received Coumadin therapy x6 months. 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  CTA showed nonocclusive thrombus in the posterior left lower lobe subsegmental branches as well as right middle lobe and posterior right lower lobe subsegmental arterial branches. Felt to be provoked after undergoing total knee replacement on 04/28/2020.  Received Eliquis therapy x6 months. 09/14/2021: LE US showed age indeterminate deep vein thrombosis involving a single right peroneal vein.  CTA showed right lower lobe segmental and subsegmental pulmonary emboli. Started on Eliquis therapy. 10/03/2021: establish care with Dr. Lorenso Cobb   CHIEF COMPLAINTS/PURPOSE OF CONSULTATION:  "Recurrent DVT/PE "  HISTORY OF PRESENTING ILLNESS:  Nancy Cobb 57 y.o. 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 She is unaccompanied for this visit. Her last visit was on 04/12/2022.   [name] reports she was not able to take the Lovenox shots due to the cost and her high deductible.  She did receive her Coumadin pills and began taking them on Saturday.  Unfortunately she has not yet tied into a Coumadin clinic.  She reports that she has had no new signs or symptoms concerning for worsening or recurrent clot.  She is not having any trouble with nausea, vomiting, or diarrhea.  She is tolerating the Coumadin well without any bleeding, bruising, or dark stools.  She states that she is down about 12 pounds (our scales do not appear to reflect this) and reports that this is due to her Ozempic.  She notes that she otherwise is at her baseline level of health.  She denies any fever,  chills, sweats, shortness of breath, chest pain, diarrhea, nausea or vomiting. She has no other complaints. Rest of the 10 point ROS is below.   MEDICAL HISTORY:  Past Medical History:  Diagnosis Date   Arthritis    Diabetes mellitus without complication ([place])    Hypertension     SURGICAL HISTORY: Past Surgical History:  Procedure Laterality Date   ABDOMINAL HYSTERECTOMY     APPENDECTOMY      SOCIAL HISTORY: Social History   Socioeconomic History   Marital status: Single    Spouse name: Not on file   Number of children: Not on file   Years of education: Not on file   Highest education level: Not on file  Occupational History   Not on file  Tobacco Use   Smoking status: Never   Smokeless tobacco: Never  Substance and Sexual Activity   Alcohol use: No   Drug use: No   Sexual activity: Not on file  Other Topics Concern   Not on file  Social History Narrative   Not on file   Social Determinants of Health   Financial Resource Strain: Not on file  Food Insecurity: Not on file  Transportation Needs: Not on file  Physical Activity: Not on file  Stress: Not on file  Social Connections: Not on file  Intimate Partner Violence: Not on file    FAMILY HISTORY: No family history on file.  ALLERGIES:  has No Known Allergies.  MEDICATIONS:  Current Outpatient Medications  Medication Sig Dispense Refill   irbesartan-hydrochlorothiazide (AVALIDE) 150-12.5 MG tablet Take 1 tablet by mouth daily.  ofloxacin (OCUFLOX) 0.3 % ophthalmic solution Place 2 drops into affected eye 2 (two) times daily. 10 mL 0   Semaglutide, 2 MG/DOSE, (OZEMPIC, 2 MG/DOSE,) 8 MG/3ML SOPN Inject 2 mg into the skin once a week. 3 mL 3   warfarin (COUMADIN) 5 MG tablet Take 5 mg by mouth daily.     No current facility-administered medications for this visit.    REVIEW OF SYSTEMS:   Constitutional: ( - ) fevers, ( - )  chills , ( - ) night sweats Eyes: ( - ) blurriness of vision, ( - ) double  vision, ( - ) watery eyes Ears, nose, mouth, throat, and face: ( - ) mucositis, ( - ) sore throat Respiratory: ( - ) cough, ( - ) dyspnea, ( - ) wheezes Cardiovascular: ( - ) palpitation, ( - ) chest discomfort, ( - ) lower extremity swelling Gastrointestinal:  ( - ) nausea, ( - ) heartburn, ( - ) change in bowel habits Skin: ( - ) abnormal skin rashes Lymphatics: ( - ) new lymphadenopathy, ( - ) easy bruising Neurological: ( - ) numbness, ( - ) tingling, ( - ) new weaknesses Behavioral/Psych: ( - ) mood change, ( - ) new changes  All other systems were reviewed with the patient and are negative.  PHYSICAL EXAMINATION:  Vitals:   07/23/22 1138  BP: 133/72  Pulse: 73  Resp: 13  Temp: 97.6 F (36.4 C)  SpO2: 100%   Filed Weights   07/23/22 1138  Weight: 233 lb 12.8 oz (106.1 kg)    GENERAL: well appearing middle-aged African-American female in NAD  SKIN: skin color, texture, turgor are normal, no rashes or significant lesions EYES: conjunctiva are pink and non-injected, sclera clear LUNGS: clear to auscultation and percussion with normal breathing effort HEART: regular rate & rhythm and no murmurs and no lower extremity edema Musculoskeletal: no cyanosis of digits and no clubbing  PSYCH: alert & oriented x 3, fluent speech NEURO: no focal motor/sensory deficits  LABORATORY DATA:  I have reviewed the data as listed    Latest Ref Rng & Units 07/23/2022   11:07 AM 04/12/2022    1:17 PM 10/03/2021   10:12 AM  CBC  WBC 4.0 - 10.5 K/uL 6.4  7.7  4.6   Hemoglobin 12.0 - 15.0 g/dL 13.8  13.0  13.9   Hematocrit 36.0 - 46.0 % 40.8  39.2  42.2   Platelets 150 - 400 K/uL 253  268  346        Latest Ref Rng & Units 07/23/2022   11:07 AM 04/12/2022    1:17 PM 10/03/2021   10:12 AM  CMP  Glucose 70 - 99 mg/dL 70  82  106   BUN 6 - 20 mg/dL 14  15  14    Creatinine 0.44 - 1.00 mg/dL 0.83  0.92  0.97   Sodium 135 - 145 mmol/L 140  140  141   Potassium 3.5 - 5.1 mmol/L 4.2  3.7  3.8    Chloride 98 - 111 mmol/L 107  107  105   CO2 22 - 32 mmol/L 28  29  30    Calcium 8.9 - 10.3 mg/dL 10.0  9.5  10.0   Total Protein 6.5 - 8.1 g/dL 7.2  7.2  7.9   Total Bilirubin 0.3 - 1.2 mg/dL 0.4  0.4  0.7   Alkaline Phos 38 - 126 U/L 48  58  52   AST 15 - 41 U/L  12  16  14    ALT 0 - 44 U/L 11  17  14       ASSESSMENT & PLAN Nancy Cobb is a 57 y.o. female who returns for a follow up for recurrent DVTs.   #Unprovoked DVT/PE # Recurrent Provoked VTEs   --First episode was in 2001 after undergo Achilles' Tendon repair. Received coumadin x 6 months. --Second episode was in 2021. Felt to be provoked after undergoing TKA. Received Eliquis x 6 months.  --Third and most recent episode was in March 2023. Felt to be unprovoked. Started on Eliquis therapy. --Cardiolipin antibodies were borderline elevated on 10/03/2021, IgG 41 U/ml, IgM 65 U/mL.Repeat cardiolipin levels today were not elevated. Beta-2 glycoprotein antibodies were not elevated when checked on 10/03/2021.  Plan:  --Patient unable to afford Eliquis or Lovenox due to cost.  Transition to Coumadin 5 mg daily --Will work with patient's primary care provider to get her tied in with a Coumadin clinic.  Checking PT/INR today. --patient denies any bleeding, bruising, or dark stools on this medication. It is well tolerated. No difficulties accessing/affording the medication  --RTC in 3 months' time with strict return precautions for overt signs of bleeding.   Orders Placed This Encounter  Procedures   Protime-INR    Standing Status:   Future    Number of Occurrences:   1    Standing Expiration Date:   07/24/2023    All questions were answered. The patient knows to call the clinic with any problems, questions or concerns.  I have spent a total of 30 minutes minutes of face-to-face and non-face-to-face time, preparing to see the patient,  performing a medically appropriate examination, counseling and educating the patient, ordering  medications/tests, documenting clinical information in the electronic health record,  and care coordination.   Ledell Peoples, MD Department of Hematology/Oncology [place] [facility] Phone: [phone] Pager: [phone] Email: [email or web address] 07/23/2022 4:33 PM

## 2022-07-24 ENCOUNTER — Telehealth: Payer: Self-pay

## 2022-07-24 NOTE — Telephone Encounter (Signed)
Faxed Dr. Libby Maw office note from 07/23/22 to PCP - Dr. Lucianne Lei. Also included message that patient will need assistance connecting to a coumadin clinic to monitor labs.

## 2022-08-22 ENCOUNTER — Other Ambulatory Visit (HOSPITAL_COMMUNITY): Payer: Self-pay

## 2022-09-19 ENCOUNTER — Telehealth: Payer: Self-pay | Admitting: Hematology and Oncology

## 2022-09-19 NOTE — Telephone Encounter (Signed)
Called patient per 4/4 IB message. Patient is going out of the country and will call back to reschedule when she is back.

## 2022-09-23 ENCOUNTER — Inpatient Hospital Stay: Payer: 59

## 2022-09-23 ENCOUNTER — Inpatient Hospital Stay: Payer: 59 | Admitting: Hematology and Oncology

## 2023-05-19 ENCOUNTER — Other Ambulatory Visit: Payer: Self-pay | Admitting: Family Medicine

## 2023-05-19 ENCOUNTER — Ambulatory Visit: Payer: 59

## 2023-05-19 ENCOUNTER — Ambulatory Visit
Admission: RE | Admit: 2023-05-19 | Discharge: 2023-05-19 | Disposition: A | Discharge status: Home / Self Care | Payer: 59 | Source: Ambulatory Visit | Attending: Family Medicine | Admitting: Family Medicine

## 2023-05-19 DIAGNOSIS — M5459 Other low back pain: Secondary | ICD-10-CM

## 2023-05-19 DIAGNOSIS — M5451 Vertebrogenic low back pain: Secondary | ICD-10-CM

## 2023-12-29 ENCOUNTER — Other Ambulatory Visit (HOSPITAL_COMMUNITY): Payer: Self-pay

## 2024-07-14 ENCOUNTER — Other Ambulatory Visit: Payer: Self-pay

## 2024-07-14 ENCOUNTER — Encounter (HOSPITAL_BASED_OUTPATIENT_CLINIC_OR_DEPARTMENT_OTHER): Payer: Self-pay | Admitting: Orthopedic Surgery

## 2024-07-14 NOTE — Progress Notes (Signed)
" °   07/14/24 1356  Pre-op Phone Call  Surgery Date Verified 07/21/24  Arrival Time Verified 1030  Surgery Location Verified [facility] [place]  Medical History Reviewed Yes  Is the patient taking a GLP-1 receptor agonist? Yes (last dose 1/23)  Has the patient been informed on holding medication? Yes  Does the patient have diabetes? Type II  Does the patient use a Continuous Blood Glucose Monitor? No  Is the patient on an insulin pump? No  Has the diabetes coordinator been notified? No  Do you have a history of heart problems? No  Does patient have other implanted devices? No  Patient Teaching Enhanced Recovery;Pre / Post Procedure  Patient educated about smoking cessation 24 hours prior to surgery. N/A Non-Smoker  Patient verbalizes understanding of bowel prep? N/A  THA/TKA patients only:  By your surgery date, will you have been taking narcotics for 90 days or greater? No  Med Rec Completed Yes  Take the Following Meds the Morning of Surgery no meds  Recent  Lab Work, EKG, CXR? No  NPO (Including gum & candy) After midnight  Allowed clear liquids Water;Gatorade  (diabetics please choose diet or no sugar options)  Patient instructed to stop clear liquids including Carb loading drink at: 0900  Stop Solids, Milk, Candy, and Gum STARTING AT MIDNIGHT  Did patient view EMMI videos? No  Responsible adult to drive and be with you for 24 hours? Yes  Name & Phone Number for Ride/Caregiver daughter  No Jewelry, money, nail polish or make-up.  No lotions, powders, perfumes. No shaving  48 hrs. prior to surgery. Yes  Contacts, Dentures & Glasses Will Have to be Removed Before OR. Yes  Please bring your ID and Insurance Card the morning of your surgery. (Surgery Centers Only) Yes  Bring any papers or x-rays with you that your surgeon gave you. Yes  Instructed to contact the location of procedure/ provider if they or anyone in their household develops symptoms or tests positive for COVID-19, has close contact  with someone who tests positive for COVID, or has known exposure to any contagious illness. Yes  Call this number the morning of surgery  with any problems that may cancel your surgery. [phone]  Covid-19 Assessment  Have you had a positive COVID-19 test within the previous 90 days? No  COVID Testing Guidance Proceed with the additional questions.  Have you been unmasked and in close contact with anyone with COVID-19 or COVID-19 symptoms within the past 10 days? No  Do you or anyone in your household currently have any COVID-19 symptoms? No   Need eliquis  hold orders Dr. Bertie office currently working on that "

## 2024-07-16 ENCOUNTER — Encounter (HOSPITAL_BASED_OUTPATIENT_CLINIC_OR_DEPARTMENT_OTHER)
Admission: RE | Admit: 2024-07-16 | Discharge: 2024-07-16 | Disposition: A | Payer: PRIVATE HEALTH INSURANCE | Source: Ambulatory Visit | Attending: Orthopedic Surgery

## 2024-07-16 ENCOUNTER — Other Ambulatory Visit: Payer: Self-pay

## 2024-07-16 DIAGNOSIS — Z01818 Encounter for other preprocedural examination: Secondary | ICD-10-CM | POA: Diagnosis present

## 2024-07-16 LAB — BASIC METABOLIC PANEL WITH GFR
Anion gap: 9 (ref 5–15)
BUN: 16 mg/dL (ref 6–20)
CO2: 24 mmol/L (ref 22–32)
Calcium: 9.2 mg/dL (ref 8.9–10.3)
Chloride: 108 mmol/L (ref 98–111)
Creatinine, Ser: 0.9 mg/dL (ref 0.44–1.00)
GFR, Estimated: >60 mL/min
Glucose, Bld: 95 mg/dL (ref 70–99)
Potassium: 4 mmol/L (ref 3.5–5.1)
Sodium: 142 mmol/L (ref 135–145)

## 2024-07-16 NOTE — Progress Notes (Addendum)
 Left message with Kirke at Dr. Bertie office regarding Eliquis  hold orders for surgery 07/21/24.

## 2024-07-21 ENCOUNTER — Ambulatory Visit (HOSPITAL_BASED_OUTPATIENT_CLINIC_OR_DEPARTMENT_OTHER): Payer: PRIVATE HEALTH INSURANCE

## 2024-07-21 ENCOUNTER — Encounter (HOSPITAL_BASED_OUTPATIENT_CLINIC_OR_DEPARTMENT_OTHER): Payer: Self-pay | Admitting: Orthopedic Surgery

## 2024-07-21 ENCOUNTER — Ambulatory Visit (HOSPITAL_BASED_OUTPATIENT_CLINIC_OR_DEPARTMENT_OTHER): Payer: PRIVATE HEALTH INSURANCE | Admitting: Anesthesiology

## 2024-07-21 ENCOUNTER — Encounter (HOSPITAL_BASED_OUTPATIENT_CLINIC_OR_DEPARTMENT_OTHER)
Admission: RE | Disposition: A | Discharge status: Home / Self Care | Payer: Self-pay | Source: Home / Self Care | Attending: Orthopedic Surgery

## 2024-07-21 ENCOUNTER — Other Ambulatory Visit: Payer: Self-pay

## 2024-07-21 ENCOUNTER — Ambulatory Visit (HOSPITAL_BASED_OUTPATIENT_CLINIC_OR_DEPARTMENT_OTHER)
Admission: RE | Admit: 2024-07-21 | Discharge: 2024-07-21 | Disposition: A | Payer: PRIVATE HEALTH INSURANCE | Source: Home / Self Care | Attending: Orthopedic Surgery | Admitting: Orthopedic Surgery

## 2024-07-21 DIAGNOSIS — Z01818 Encounter for other preprocedural examination: Secondary | ICD-10-CM

## 2024-07-21 HISTORY — DX: Acute embolism and thrombosis of unspecified deep veins of unspecified lower extremity: I82.409

## 2024-07-21 LAB — GLUCOSE, CAPILLARY
Glucose-Capillary: 84 mg/dL (ref 70–99)
Glucose-Capillary: 62 mg/dL — ABNORMAL LOW (ref 70–99)
Glucose-Capillary: 65 mg/dL — ABNORMAL LOW (ref 70–99)
Glucose-Capillary: 81 mg/dL (ref 70–99)

## 2024-07-21 MED ORDER — LACTATED RINGERS IV SOLN: INTRAVENOUS | Status: DC

## 2024-07-21 MED ORDER — OXYCODONE HCL 5 MG/5ML PO SOLN: 5.0000 mg | Freq: Once | ORAL | Status: DC | PRN

## 2024-07-21 MED ORDER — OXYCODONE HCL 5 MG PO TABS: 5.0000 mg | ORAL_TABLET | Freq: Four times a day (QID) | ORAL | 0 refills | Status: AC | PRN

## 2024-07-21 MED ORDER — CEFAZOLIN SODIUM-DEXTROSE 2-4 GM/100ML-% IV SOLN
INTRAVENOUS | Status: AC
  Filled 2024-07-21: qty 100

## 2024-07-21 MED ORDER — CEFAZOLIN SODIUM-DEXTROSE 2-4 GM/100ML-% IV SOLN
2.0000 g | INTRAVENOUS | Status: AC
  Administered 2024-07-21: 2 g via INTRAVENOUS

## 2024-07-21 MED ORDER — FENTANYL CITRATE (PF) 100 MCG/2ML IJ SOLN
INTRAMUSCULAR | Status: AC
  Filled 2024-07-21: qty 2

## 2024-07-21 MED ORDER — ROPIVACAINE HCL 5 MG/ML IJ SOLN
INTRAMUSCULAR | Status: DC | PRN
  Administered 2024-07-21: 30 mL via PERINEURAL

## 2024-07-21 MED ORDER — PROPOFOL 500 MG/50ML IV EMUL
INTRAVENOUS | Status: DC | PRN
  Administered 2024-07-21: 125 ug/kg/min via INTRAVENOUS

## 2024-07-21 MED ORDER — FENTANYL CITRATE (PF) 100 MCG/2ML IJ SOLN
100.0000 ug | Freq: Once | INTRAMUSCULAR | Status: AC
  Administered 2024-07-21: 100 ug via INTRAVENOUS

## 2024-07-21 MED ORDER — HYDROMORPHONE HCL 1 MG/ML IJ SOLN: 0.2500 mg | INTRAMUSCULAR | Status: DC | PRN

## 2024-07-21 MED ORDER — OXYCODONE HCL 5 MG PO TABS: 5.0000 mg | ORAL_TABLET | Freq: Once | ORAL | Status: DC | PRN

## 2024-07-21 MED ORDER — MIDAZOLAM HCL (PF) 2 MG/2ML IJ SOLN
2.0000 mg | Freq: Once | INTRAMUSCULAR | Status: AC
  Administered 2024-07-21: 2 mg via INTRAVENOUS

## 2024-07-21 MED ORDER — LIDOCAINE HCL (CARDIAC) PF 100 MG/5ML IV SOSY
PREFILLED_SYRINGE | INTRAVENOUS | Status: DC | PRN
  Administered 2024-07-21: 20 mg via INTRAVENOUS

## 2024-07-21 MED ORDER — LIDOCAINE 2% (20 MG/ML) 5 ML SYRINGE
INTRAMUSCULAR | Status: AC
  Filled 2024-07-21: qty 25

## 2024-07-21 MED ORDER — MIDAZOLAM HCL 2 MG/2ML IJ SOLN
INTRAMUSCULAR | Status: AC
  Filled 2024-07-21: qty 2

## 2024-07-21 MED ORDER — ONDANSETRON HCL 4 MG/2ML IJ SOLN
INTRAMUSCULAR | Status: DC | PRN
  Administered 2024-07-21: 4 mg via INTRAVENOUS

## 2024-07-21 MED ORDER — 0.9 % SODIUM CHLORIDE (POUR BTL) OPTIME
TOPICAL | Status: DC | PRN
  Administered 2024-07-21: 100 mL

## 2024-07-21 MED ORDER — DEXMEDETOMIDINE HCL IN NACL 80 MCG/20ML IV SOLN
INTRAVENOUS | Status: DC | PRN
  Administered 2024-07-21 (×2): 4 ug via INTRAVENOUS

## 2024-07-21 NOTE — Anesthesia Postprocedure Evaluation (Signed)
"   Anesthesia Post Note  Patient: Amila Callies  Procedure(s) Performed: Right thumb ulnar collateral ligament reconstruction, harvest of palmaris longus tendon (Right: Hand)     Patient location during evaluation: PACU Anesthesia Type: MAC Level of consciousness: awake and alert Pain management: pain level controlled Vital Signs Assessment: post-procedure vital signs reviewed and stable Respiratory status: spontaneous breathing, nonlabored ventilation and respiratory function stable Cardiovascular status: blood pressure returned to baseline and stable Postop Assessment: no apparent nausea or vomiting Anesthetic complications: no   No notable events documented.  Last Vitals:  Vitals:   07/21/24 1230 07/21/24 1300  BP: 111/75 (!) 111/55  Pulse: 60 62  Resp: 14 16  Temp:  (!) 36.4 C  SpO2: 95% 98%    Last Pain:  Vitals:   07/21/24 1300  TempSrc: Temporal  PainSc: 0-No pain                 Butler Levander Pinal      "

## 2024-07-21 NOTE — Discharge Instructions (Addendum)
 Nancy Cobb, M.D. Hand Surgery  POST-OPERATIVE DISCHARGE INSTRUCTIONS   PRESCRIPTIONS: You may have been given a prescription to be taken as directed for post-operative pain control.  You may also take over the counter ibuprofen/aleve  and tylenol  for pain. Take this as directed on the packaging. Do not exceed 3000 mg tylenol /acetaminophen  in 24 hours.  Ibuprofen 600-800 mg (3-4) tablets by mouth every 6 hours as needed for pain.  OR Aleve  2 tablets by mouth every 12 hours (twice daily) as needed for pain.  AND/OR Tylenol  1000 mg (2 tablets) every 8 hours as needed for pain.  Please use your pain medication carefully, as refills are limited and you may not be provided with one.  As stated above, please use over the counter pain medicine - it will also be helpful with decreasing your swelling.    ANESTHESIA: After your surgery, post-surgical discomfort or pain is likely. This discomfort can last several days to a few weeks. At certain times of the day your discomfort may be more intense.   Did you receive a nerve block?  A nerve block can provide pain relief for one hour to two days after your surgery. As long as the nerve block is working, you will experience little or no sensation in the area the surgeon operated on.  As the nerve block wears off, you will begin to experience pain or discomfort. It is very important that you begin taking your prescribed pain medication before the nerve block fully wears off. Treating your pain at the first sign of the block wearing off will ensure your pain is better controlled and more tolerable when full-sensation returns. Do not wait until the pain is intolerable, as the medicine will be less effective. It is better to treat pain in advance than to try and catch up.   General Anesthesia:  If you did not receive a nerve block during your surgery, you will need to start taking your pain medication shortly after your surgery and should continue  to do so as prescribed by your surgeon.     ICE AND ELEVATION: You may use ice for the first 48-72 hours, but it is not critical.   Motion of your fingers is very important to decrease the swelling.  Elevation, as much as possible for the next 48 hours, is critical for decreasing swelling as well as for pain relief. Elevation means when you are seated or lying down, you hand should be at or above your heart. When walking, the hand needs to be at or above the level of your elbow.  If the bandage gets too tight, it may need to be loosened. Please contact our office and we will instruct you in how to do this.    SURGICAL BANDAGES:  Keep your dressing and/or splint clean and dry at all times.  Do not remove until you are seen again in the office.  If careful, you may place a plastic bag over your bandage and tape the end to shower, but be careful, do not get your bandages wet.     HAND THERAPY:  You will be contacted to set up your first therapy visit    ACTIVITY AND WORK: You are encouraged to move any fingers which are not in the bandage.  Light use of the fingers is allowed to assist the other hand with daily hygiene and eating, but strong gripping or lifting is often uncomfortable and should be avoided.  You might miss a variable  period of time from work and hopefully this issue has been discussed prior to surgery. You may not do any heavy work with your affected hand for about 2 weeks.    [facility] Second Floor, [address] [place], [place] [address] [phone]   Regional Anesthesia Blocks  1. You may not be able to move or feel the blocked extremity after a regional anesthetic block. This may last may last from 3-48 hours after placement, but it will go away. The length of time depends on the medication injected and your individual response to the medication. As the nerves start to wake up, you may experience tingling as the movement and feeling returns to your  extremity. If the numbness and inability to move your extremity has not gone away after 48 hours, please call your surgeon.   2. The extremity that is blocked will need to be protected until the numbness is gone and the strength has returned. Because you cannot feel it, you will need to take extra care to avoid injury. Because it may be weak, you may have difficulty moving it or using it. You may not know what position it is in without looking at it while the block is in effect.  3. For blocks in the legs and feet, returning to weight bearing and walking needs to be done carefully. You will need to wait until the numbness is entirely gone and the strength has returned. You should be able to move your leg and foot normally before you try and bear weight or walk. You will need someone to be with you when you first try to ensure you do not fall and possibly risk injury.  4. Bruising and tenderness at the needle site are common side effects and will resolve in a few days.  5. Persistent numbness or new problems with movement should be communicated to the surgeon or the [facility] [phone] [facility] [phone]).  Post Anesthesia Home Care Instructions  Activity: Get plenty of rest for the remainder of the day. A responsible individual must stay with you for 24 hours following the procedure.  For the next 24 hours, DO NOT: -Drive a car -Advertising copywriter -Drink alcoholic beverages -Take any medication unless instructed by your physician -Make any legal decisions or sign important papers.  Meals: Start with liquid foods such as gelatin or soup. Progress to regular foods as tolerated. Avoid greasy, spicy, heavy foods. If nausea and/or vomiting occur, drink only clear liquids until the nausea and/or vomiting subsides. Call your physician if vomiting continues.  Special Instructions/Symptoms: Your throat may feel dry or sore from the anesthesia or the breathing  tube placed in your throat during surgery. If this causes discomfort, gargle with warm salt water. The discomfort should disappear within 24 hours.  If you had a scopolamine patch placed behind your ear for the management of post- operative nausea and/or vomiting:  1. The medication in the patch is effective for 72 hours, after which it should be removed.  Wrap patch in a tissue and discard in the trash. Wash hands thoroughly with soap and water. 2. You may remove the patch earlier than 72 hours if you experience unpleasant side effects which may include dry mouth, dizziness or visual disturbances. 3. Avoid touching the patch. Wash your hands with soap and water after contact with the patch.

## 2024-07-21 NOTE — Progress Notes (Signed)
 Assisted Dr. Hyacinth Meeker with right, supraclavicular, ultrasound guided block. Side rails up, monitors on throughout procedure. See vital signs in flow sheet. Tolerated Procedure well.

## 2024-07-21 NOTE — Transfer of Care (Signed)
 Immediate Anesthesia Transfer of Care Note  Patient: Nancy Cobb  Procedure(s) Performed: Right thumb ulnar collateral ligament reconstruction, harvest of palmaris longus tendon (Right: Hand)  Patient Location: PACU  Anesthesia Type:MAC and Regional  Level of Consciousness: drowsy  Airway & Oxygen Therapy: Patient Spontanous Breathing  Post-op Assessment: Report given to RN and Post -op Vital signs reviewed and stable  Post vital signs: Reviewed and stable  Last Vitals:  Vitals Value Taken Time  BP 110/63 07/21/24 11:51  Temp    Pulse 65 07/21/24 11:53  Resp 12 07/21/24 11:53  SpO2 94 % 07/21/24 11:53  Vitals shown include unfiled device data.  Last Pain:  Vitals:   07/21/24 0926  TempSrc: Temporal  PainSc: 0-No pain         Complications: No notable events documented.

## 2024-07-21 NOTE — Anesthesia Procedure Notes (Signed)
 Anesthesia Regional Block: Supraclavicular block   Pre-Anesthetic Checklist: , timeout performed,  Correct Patient, Correct Site, Correct Laterality,  Correct Procedure, Correct Position, site marked,  Risks and benefits discussed,  Surgical consent,  Pre-op evaluation,  At surgeon's request and post-op pain management  Laterality: Right  Prep: chloraprep       Needles:  Injection technique: Single-shot  Needle Type: Stimiplex     Needle Length: 9cm  Needle Gauge: 21     Additional Needles:   Procedures:,,,, ultrasound used (permanent image in chart),,    Narrative:  Start time: 07/21/2024 9:53 AM End time: 07/21/2024 9:58 AM Injection made incrementally with aspirations every 5 mL.  Performed by: Personally  Anesthesiologist: Cleotilde Butler Dade, MD

## 2024-07-21 NOTE — Anesthesia Preprocedure Evaluation (Signed)
"                                    Anesthesia Evaluation  Patient identified by MRN, date of birth, ID band Patient awake    Reviewed: Allergy & Precautions, H&P , NPO status , Patient's Chart, lab work & pertinent test results  Airway Mallampati: II  TM Distance: >3 FB Neck ROM: Full    Dental no notable dental hx.    Pulmonary neg pulmonary ROS   Pulmonary exam normal breath sounds clear to auscultation       Cardiovascular hypertension, Pt. on medications negative cardio ROS Normal cardiovascular exam Rhythm:Regular Rate:Normal     Neuro/Psych negative neurological ROS  negative psych ROS   GI/Hepatic negative GI ROS, Neg liver ROS,,,  Endo/Other  negative endocrine ROSdiabetes, Type 2    Renal/GU negative Renal ROS  negative genitourinary   Musculoskeletal  (+) Arthritis , Osteoarthritis,    Abdominal  (+) + obese  Peds negative pediatric ROS (+)  Hematology negative hematology ROS (+)   Anesthesia Other Findings   Reproductive/Obstetrics negative OB ROS                              Anesthesia Physical Anesthesia Plan  ASA: 3  Anesthesia Plan: MAC   Post-op Pain Management: Regional block*   Induction: Intravenous  PONV Risk Score and Plan: 2 and Propofol  infusion and Treatment may vary due to age or medical condition  Airway Management Planned: Simple Face Mask  Additional Equipment:   Intra-op Plan:   Post-operative Plan:   Informed Consent: I have reviewed the patients History and Physical, chart, labs and discussed the procedure including the risks, benefits and alternatives for the proposed anesthesia with the patient or authorized representative who has indicated his/her understanding and acceptance.     Dental advisory given  Plan Discussed with: CRNA  Anesthesia Plan Comments:         Anesthesia Quick Evaluation  "

## 2024-07-21 NOTE — Interval H&P Note (Signed)
 History and Physical Interval Note:  07/21/2024 9:25 AM  Nancy Cobb  has presented today for surgery, with the diagnosis of Right thumb ulna collateral ligament rupture.  The various methods of treatment have been discussed with the patient and family. After consideration of risks, benefits and other options for treatment, the patient has consented to  Procedures with comments: REPAIR, LIGAMENT (Right) - Right thumb ulna collateral ligament repair versus reconstruction as a surgical intervention.  The patient's history has been reviewed, patient examined, no change in status, stable for surgery.  I have reviewed the patient's chart and labs.  Questions were answered to the patient's satisfaction.     Alonzo Loving

## 2024-07-21 NOTE — Op Note (Signed)
 "  Date of Surgery: 07/21/2024  INDICATIONS: Patient is a 59 y.o.-year-old female with rupture of the right thumb ulnar collateral ligament.  This occurred approximately 3-4 months ago.  She has gross instability on exam and pain with activity and her daily tasks.  Risks, benefits, and alternatives to surgery were again discussed with the patient in the preoperative area. The patient wishes to proceed with surgery.  Informed consent was signed after our discussion.   PREOPERATIVE DIAGNOSIS:  Chronic right thumb UCL rupture  POSTOPERATIVE DIAGNOSIS: Same.  PROCEDURE: Right thumb ulnar collateral ligament (UCL) reconstruction with palmaris longus autograft   SURGEON: Carlin Galla, M.D.  ASSIST: None  ANESTHESIA:  Regional, MAC  IV FLUIDS AND URINE: See anesthesia.  ESTIMATED BLOOD LOSS: <5 mL.  IMPLANTS:  Implant Name Type Inv. Item Serial No. Manufacturer Lot No. LRB No. Used Action  ANCHOR REPAIR HAND WRIST - [id number] Anchor ANCHOR REPAIR HAND WRIST  [employer] [id number] Right 1 Implanted     DRAINS: None  COMPLICATIONS: None noted  DESCRIPTION OF PROCEDURE:   The patient was met in the preoperative holding area where the surgical site was marked and the consent form was signed. The patient was brought to the operating room and remained on the stretcher.  A hand table was placed adjacent to the operative extremity and locked into place.  All bony prominences were well-padded.  Monitored anesthesia was induced.  Preoperative IV antibiotics were given.  A tourniquet was placed high on the right upper arm.  The right upper extremity was then prepped and draped in the usual and sterile fashion.  A formal timeout was performed to confirm that this is the correct patient, surgical side, surgical site, and surgical procedure.  All necessary implants were in the room.  Following formal timeout, a curvilinear incision was made at the ulnar aspect of the thumb of the MCP joint.  The  skin was incised.  Careful longitudinal dissection was used to identify branches of the superficial radial  nerve.  There was a large branch of the volar portion of the incision.  This was protected throughout the case.  The adductor aponeurosis was identified.  It was divided longitudinally.  Careful dissection with a 15 blade was used to identify the ulnar collateral ligament.  There was a small bony fragment attached to the ligament.  The ligament was quite contracted and scarred.  I made a significant effort to try to get this ligament back out to length.  Despite considerable attempts, I was unable to get the ligament back out to the appropriate length.  It would not reach the anatomic footprint of the ligament.  Given the scarring and contracture of the ligament, and his overall poor quality, I elected to proceed with ligament reconstruction.  I turned my attention to the volar aspect of the forearm.  A small transverse incision was made at the wrist flexion crease.  Careful dissection was used to identify the palmaris longus tendon.  A second counterincision was made about 8 cm proximal to the wrist flexion crease.  The palmaris longus tendon was identified.  This tendon was isolated using small right angle clamps.  The tendon was transected distally.  The tendon was pulled of more proximal wound and was transected proximally.  A whipstitch was placed in one end of the graft.  The graft was trimmed to size such that it was 2 mm in width.  I then returned to the ulnar aspect of the thumb MCP  joint.  Two guidewires were placed at the anatomic footprint of the ulnar collateral ligament.  This was overdrilled.  The palmaris longus tendon graft and a limb of suture tape were then placed in the fork of the anchor.  The anchor was then deployed in the metacarpal drill hole at the footprint of the UCL origin.  The thumb was then held in about 30 degrees of flexion at the MCP joint with ulnar deviation.  The graft and  tape was then placed within the fork of the anchor.  The anchor was deployed at the UCL insertion at the proximal phalanx.  The excess graft and suture tape was cut.  There was excellent stability at the thumb MCP joint.  She was somewhat tight with attempted flexion of the MCP joint.  The suture anchors were appropriately seated within the bone tunnels.  AP and lateral view showed appropriate reduction of the thumb MCP joint.  The wound was then thoroughly irrigated with copious sterile saline.  The adductor aponeurosis was then closed using a 4-0 Mersilene suture.  The skin was then closed in layered fashion using a 4-0 Monocryl suture in a buried interrupted fashion followed by a 4-0 Vicryl Rapide suture in horizontal mattress configuration.  The incisions on the volar aspect of the forearm were then closed with 4-0 Vicryl Rapide suture in horizontal mattress fashion.  The skin was then cleaned and dried.  It was dressed with Xeroform, folded 4 x 4's, cast padding, and a well-padded thumb spica splint was applied.  The tourniquet was deflated.  The fingers were pink and well-perfused.  The patient was reversed from sedation.  All counts were correct x 2 at the end of the procedure.  The patient was then taken to the PACU in stable condition.   POSTOPERATIVE PLAN: She will be discharged home with appropriate pain medication and discharge instructions.  A referral has been made to hand therapy.  I will see her back in the office in 10 to 14 days for her first postop visit.  Carlin Galla, MD 11:54 AM  "

## 2024-07-21 NOTE — H&P (Signed)
 " HAND SURGERY   HPI: Patient is a 59 y.o. female who presents with a chronic right thumb UCL injury with resulting instability.  Her symptoms are significantly interfering with her quality of life.  Patient denies any changes to their medical history or new systemic symptoms today.    Past Medical History:  Diagnosis Date   Arthritis    Diabetes mellitus without complication (HCC)    DVT (deep venous thrombosis) (HCC)    Hypertension    Past Surgical History:  Procedure Laterality Date   ABDOMINAL HYSTERECTOMY     APPENDECTOMY     Social History   Socioeconomic History   Marital status: Single    Spouse name: Not on file   Number of children: Not on file   Years of education: Not on file   Highest education level: Not on file  Occupational History   Not on file  Tobacco Use   Smoking status: Never   Smokeless tobacco: Never  Vaping Use   Vaping status: Never Used  Substance and Sexual Activity   Alcohol use: No   Drug use: No   Sexual activity: Not on file  Other Topics Concern   Not on file  Social History Narrative   Not on file   Social Drivers of Health   Tobacco Use: Low Risk (07/14/2024)   Patient History    Smoking Tobacco Use: Never    Smokeless Tobacco Use: Never    Passive Exposure: Not on file  Financial Resource Strain: Not on file  Food Insecurity: Not on file  Transportation Needs: Not on file  Physical Activity: Not on file  Stress: Not on file  Social Connections: Not on file  Depression (EYV7-0): Not on file  Alcohol Screen: Not on file  Housing: Not on file  Utilities: Not on file  Health Literacy: Not on file   History reviewed. No pertinent family history. - negative except otherwise stated in the family history section Allergies[1] Prior to Admission medications  Medication Sig Start Date End Date Taking? Authorizing Provider  apixaban  (ELIQUIS ) 5 MG TABS tablet Take 5 mg by mouth 2 (two) times daily.   Yes [provider]  irbesartan -hydrochlorothiazide  (AVALIDE) 150-12.5 MG tablet Take 1 tablet by mouth daily. 09/17/21  Yes [provider]  ofloxacin  (OCUFLOX ) 0.3 % ophthalmic solution Place 2 drops into affected eye 2 (two) times daily. 06/25/22     Semaglutide , 2 MG/DOSE, (OZEMPIC , 2 MG/DOSE,) 8 MG/3ML SOPN Inject 2 mg into the skin once a week. 06/25/22     warfarin (COUMADIN ) 5 MG tablet Take 5 mg by mouth daily. 07/18/22   [provider]   No results found. - Positive ROS: All other systems have been reviewed and were otherwise negative with the exception of those mentioned in the HPI and as above.  Physical Exam: General: No acute distress, resting comfortably Cardiovascular: BUE warm and well perfused, normal rate Respiratory: Normal WOB on RA Skin: Warm and dry Neurologic: Sensation intact distally Psychiatric: Patient is at baseline mood and affect  Right Upper Extremity  Moderate swelling of the thumb at the ulnar aspect of the MCP joint. This area is very tender to palpation. She has some limited range of motion at this MCP joint secondary to pain. She has gross instability with examination of the ulnar collateral ligament with no firm endpoint as compared to the contralateral side. She has full and painless range of motion of the remaining fingers. She has full painless  range of motion of her wrist. Sensation is intact to light touch throughout the hand. All fingers are pink and well-perfused.  Assessment: 59 yo F w/ chronic right thumb UCL injury with resulting instability and pain.  Plan: OR today for right thumb UCL repair versus reconstruction.  We reviewed the risks of surgery which include bleeding, infection, damage to neurovascular structures, persistent symptoms, persistent instability, stiffness, need for additional surgery.   We reviewed the expected postoperative course and recovery process.    Bebe Galla, M.D. [facility] 8:31 AM     [1] No Known  Allergies  "

## 2024-07-22 ENCOUNTER — Encounter (HOSPITAL_BASED_OUTPATIENT_CLINIC_OR_DEPARTMENT_OTHER): Payer: Self-pay | Admitting: Orthopedic Surgery
